# Patient Record
Sex: Female | Born: 1970
Health system: Southern US, Community
[De-identification: ages and names within clinical notes are randomized; demographics above are authoritative.]

## PROBLEM LIST (undated history)

## (undated) DIAGNOSIS — I10 Essential (primary) hypertension: Secondary | ICD-10-CM

## (undated) DIAGNOSIS — H8390 Unspecified disease of inner ear, unspecified ear: Secondary | ICD-10-CM

## (undated) DIAGNOSIS — G43909 Migraine, unspecified, not intractable, without status migrainosus: Secondary | ICD-10-CM

## (undated) DIAGNOSIS — K589 Irritable bowel syndrome without diarrhea: Secondary | ICD-10-CM

## (undated) HISTORY — DX: Unspecified disease of inner ear, unspecified ear: H83.90

## (undated) HISTORY — DX: Essential (primary) hypertension: I10

## (undated) HISTORY — DX: Irritable bowel syndrome, unspecified: K58.9

## (undated) HISTORY — DX: Migraine, unspecified, not intractable, without status migrainosus: G43.909

## (undated) HISTORY — PX: WISDOM TOOTH EXTRACTION: SHX21

## (undated) HISTORY — PX: NASAL SINUS SURGERY: SHX719

---
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
## 1997-07-06 ENCOUNTER — Inpatient Hospital Stay (HOSPITAL_COMMUNITY): Admission: AD | Admit: 1997-07-06 | Discharge: 1997-07-08 | Payer: Self-pay | Admitting: *Deleted

## 1997-07-10 ENCOUNTER — Encounter (HOSPITAL_COMMUNITY): Admission: RE | Admit: 1997-07-10 | Discharge: 1997-10-08 | Payer: Self-pay | Admitting: *Deleted

## 1997-10-20 ENCOUNTER — Encounter (HOSPITAL_COMMUNITY): Admission: RE | Admit: 1997-10-20 | Discharge: 1997-12-30 | Payer: Self-pay | Admitting: *Deleted

## 1998-08-18 ENCOUNTER — Other Ambulatory Visit: Admission: RE | Admit: 1998-08-18 | Discharge: 1998-08-18 | Payer: Self-pay | Admitting: *Deleted

## 1999-07-31 ENCOUNTER — Other Ambulatory Visit: Admission: RE | Admit: 1999-07-31 | Discharge: 1999-07-31 | Payer: Self-pay | Admitting: *Deleted

## 2000-02-05 ENCOUNTER — Inpatient Hospital Stay (HOSPITAL_COMMUNITY): Admission: AD | Admit: 2000-02-05 | Discharge: 2000-02-07 | Payer: Self-pay | Admitting: *Deleted

## 2000-02-14 ENCOUNTER — Encounter: Admission: RE | Admit: 2000-02-14 | Discharge: 2000-05-14 | Payer: Self-pay | Admitting: *Deleted

## 2000-03-17 ENCOUNTER — Other Ambulatory Visit: Admission: RE | Admit: 2000-03-17 | Discharge: 2000-03-17 | Payer: Self-pay | Admitting: *Deleted

## 2001-04-01 ENCOUNTER — Other Ambulatory Visit: Admission: RE | Admit: 2001-04-01 | Discharge: 2001-04-01 | Payer: Self-pay | Admitting: *Deleted

## 2002-04-15 ENCOUNTER — Other Ambulatory Visit: Admission: RE | Admit: 2002-04-15 | Discharge: 2002-04-15 | Payer: Self-pay | Admitting: *Deleted

## 2003-05-11 ENCOUNTER — Other Ambulatory Visit: Admission: RE | Admit: 2003-05-11 | Discharge: 2003-05-11 | Payer: Self-pay | Admitting: *Deleted

## 2004-06-14 ENCOUNTER — Other Ambulatory Visit: Admission: RE | Admit: 2004-06-14 | Discharge: 2004-06-14 | Payer: Self-pay | Admitting: Obstetrics and Gynecology

## 2008-01-01 ENCOUNTER — Encounter: Admission: RE | Admit: 2008-01-01 | Discharge: 2008-01-01 | Payer: Self-pay | Admitting: Internal Medicine

## 2010-01-16 ENCOUNTER — Encounter
Admission: RE | Admit: 2010-01-16 | Discharge: 2010-01-16 | Payer: Self-pay | Source: Home / Self Care | Attending: Gastroenterology | Admitting: Gastroenterology

## 2012-04-16 ENCOUNTER — Other Ambulatory Visit: Payer: Self-pay | Admitting: Occupational Medicine

## 2012-04-16 ENCOUNTER — Ambulatory Visit: Payer: Self-pay

## 2012-04-16 DIAGNOSIS — R7612 Nonspecific reaction to cell mediated immunity measurement of gamma interferon antigen response without active tuberculosis: Secondary | ICD-10-CM

## 2012-10-30 ENCOUNTER — Ambulatory Visit (INDEPENDENT_AMBULATORY_CARE_PROVIDER_SITE_OTHER): Payer: BC Managed Care – PPO | Admitting: Obstetrics and Gynecology

## 2012-10-30 ENCOUNTER — Encounter: Payer: Self-pay | Admitting: Obstetrics and Gynecology

## 2012-10-30 VITALS — BP 130/80 | HR 60 | Temp 98.6°F | Ht 65.5 in | Wt 154.0 lb

## 2012-10-30 DIAGNOSIS — Z01419 Encounter for gynecological examination (general) (routine) without abnormal findings: Secondary | ICD-10-CM

## 2012-10-30 DIAGNOSIS — Z0000 Encounter for general adult medical examination without abnormal findings: Secondary | ICD-10-CM

## 2012-10-30 DIAGNOSIS — N920 Excessive and frequent menstruation with regular cycle: Secondary | ICD-10-CM

## 2012-10-30 LAB — POCT URINALYSIS DIPSTICK
Bilirubin, UA: NEGATIVE
Blood, UA: NEGATIVE
Glucose, UA: NEGATIVE
Ketones, UA: NEGATIVE
Leukocytes, UA: NEGATIVE
Nitrite, UA: NEGATIVE
Protein, UA: NEGATIVE
Urobilinogen, UA: NEGATIVE
pH, UA: 5

## 2012-10-30 NOTE — Progress Notes (Signed)
Patient ID: Denise Jackson, female   DOB: 1971-01-12, 42 y.o.   MRN: [id number] GYNECOLOGY VISIT  PCP:  Gibson Ramp, MD  Referring provider:   HPI: 42 y.o.   Single  Caucasian  female   [id number] with Patient's last menstrual period was 10/09/2012.   here for  AEX.    Regular menses.  Menses last 7 days, becomes heavy on day 3 or 4 with clotting that is significant and clothing staining if not careful. Does it almost every month. Some cramping but not every cycle.  Feels a lot of "pressure."  Lost 10 pounds through [employer].  Some exercise. Had physical exam with Dr. Eula Listen a couple of weeks ago - normal cholesterol, thyroid, CBC.  Hgb:  PCP Urine: Neg  GYNECOLOGIC HISTORY: Patient's last menstrual period was 10/09/2012. Sexually active:  yes Partner preference: female Contraception: condoms   Menopausal hormone therapy: n/a DES exposure:   no Blood transfusions:   no Sexually transmitted diseases:  no  GYN Procedures:  no Mammogram:  10/2011 wnl - did a recall mammogram last year and was all OK.  Has appointment for 11-16-12 [facility].     Pap:  09/2010 wnl. History of abnormal pap smear:  no   OB History   Grav Para Term Preterm Abortions TAB SAB Ect Mult Living   3 3 3       3        LIFESTYLE: Exercise:     walking          Tobacco:     no Alcohol:       no Drug use:   no  OTHER HEALTH MAINTENANCE: Tetanus/TDap:   2012 Gardisil: NA Influenza:   09/2012 Zostavax:  NA  Bone density:  2009 wnl with PCP--[facility]:  n/a  Cholesterol check:  09/2012 wnl  Family History  Problem Relation Age of Onset  . Breast cancer Maternal Aunt     also with fallopian tube CA--DEC  . Hypertension Father   . Diabetes Maternal Grandmother   . Hypertension Maternal Grandmother   . Thyroid disease Maternal Grandmother   . Hypertension Maternal Grandfather   . Stroke Maternal Grandfather   . Hypertension Paternal Grandmother   . Hypertension Paternal  Grandfather     There are no active problems to display for this patient.  History reviewed. No pertinent past medical history.  Past Surgical History  Procedure Laterality Date  . Nasal sinus surgery  1988, 1990    ALLERGIES: Review of patient's allergies indicates no known allergies.  Current Outpatient Prescriptions  Medication Sig Dispense Refill  . ALPRAZolam (XANAX) 0.25 MG tablet Take 1 tablet by mouth as needed.      . Ascorbic Acid (VITAMIN C) 100 MG tablet Take 100 mg by mouth daily.      . calcium carbonate (OS-CAL) 600 MG TABS tablet Take 600 mg by mouth 2 (two) times daily with a meal.      . lactobacillus acidophilus (BACID) TABS tablet Take 2 tablets by mouth 3 (three) times daily.      . Multiple Vitamin (MULTIVITAMIN) capsule Take 1 capsule by mouth daily.      . SUMAtriptan (IMITREX) 25 MG tablet Take 25 mg by mouth every 2 (two) hours as needed for migraine. May repeat in 2 hours if headache persists or recurs.       No current facility-administered medications for this visit.     ROS:  Pertinent items are noted in HPI.  SOCIAL HISTORY:  Married.  Three children. Works for [employer].  PHYSICAL EXAMINATION:    BP 130/80  Pulse 60  Temp(Src) 98.6 F (37 C) (Oral)  Ht 5' 5.5" (1.664 m)  Wt 154 lb (69.854 kg)  BMI 25.23 kg/m2  LMP 10/09/2012   Wt Readings from Last 3 Encounters:  10/30/12 154 lb (69.854 kg)     Ht Readings from Last 3 Encounters:  10/30/12 5' 5.5" (1.664 m)    General appearance: alert, cooperative and appears stated age Head: Normocephalic, without obvious abnormality, atraumatic Neck: no adenopathy, supple, symmetrical, trachea midline and thyroid not enlarged, symmetric, no tenderness/mass/nodules Lungs: clear to auscultation bilaterally Breasts: Inspection negative, No nipple retraction or dimpling, No nipple discharge or bleeding, No axillary or supraclavicular adenopathy, Normal to palpation without dominant  masses Heart: regular rate and rhythm Abdomen: soft, non-tender; no masses,  no organomegaly Extremities: extremities normal, atraumatic, no cyanosis or edema Skin: Skin color, texture, turgor normal. No rashes or lesions Lymph nodes: Cervical, supraclavicular, and axillary nodes normal. No abnormal inguinal nodes palpated Neurologic: Grossly normal  Pelvic: External genitalia:  no lesions              Urethra:  normal appearing urethra with no masses, tenderness or lesions              Bartholins and Skenes: normal                 Vagina: normal appearing vagina with normal color and discharge, no lesions              Cervix: normal .  Bled with pap from endoocervical brush.  I think I broke up some minor scar tissue - stenosis.               Pap and high risk HPV testing done: yes.            Bimanual Exam:  Uterus:  uterus is normal size, shape, consistency and nontender                                      Adnexa: normal adnexa in size, nontender and no masses                                      Rectovaginal: Confirms                                      Anus:  normal sphincter tone, no lesions  ASSESSMENT  Normal gynecologic exam. Menorrhagia.  PLAN  Mammogram at [facility]. Pap smear and high risk HPV testing Return for pelvic ultrasound, sonohysterogram, and endometrial biopsy.  Return annually or prn   An After Visit Summary was printed and given to the patient.

## 2012-10-30 NOTE — Patient Instructions (Signed)

## 2012-11-02 ENCOUNTER — Encounter: Payer: Self-pay | Admitting: Obstetrics and Gynecology

## 2012-11-02 NOTE — Addendum Note (Signed)
Addended by: Conley Simmonds on: 11/02/2012 03:05 PM   Modules accepted: Orders

## 2012-11-05 ENCOUNTER — Telehealth: Payer: Self-pay | Admitting: Obstetrics and Gynecology

## 2012-11-05 LAB — IPS PAP TEST WITH HPV

## 2012-11-09 ENCOUNTER — Telehealth: Payer: Self-pay | Admitting: Obstetrics and Gynecology

## 2012-11-09 NOTE — Telephone Encounter (Signed)
Patient called back in regards to our previous conversation about her benefits for a SHGM. Patient owes [phone] for a SHGM/Endo bx and would like to think about it for a little bit before committing to that amount. Patient also had a question in regards to her options. Patient would like to find out why she is having this problem. Dr. Edward Jolly, mention 3 options. Patient unsure about all options (ablation, b/c, and mirena). Patient would like to know if these will be her only options? Patient is trying to find the best option for her in regards to treatment and cost.

## 2012-11-09 NOTE — Telephone Encounter (Signed)
Phone call to discuss alternative to proceeding with a pelvic ultrasound, sonohysterogram, and endometrial biopsy.  I would be OK with a 3 month trial of low dose combined OCPs or a 3 month trial of a progesterone only OCP to see if symptoms of menorrhagia resolve. Patient may need instructions in use and discussion of side effects.  Patient would then need a three month recheck with me for re-evaluation.  We could then determine the necessity of proceeding with potential imaging and an endometrial biopsy.

## 2012-11-09 NOTE — Telephone Encounter (Signed)
I left patient a message to call in to speak with Triage tomorrow or with me on Wednesday.  I will offer a 3 month trial of an OCP first.  If symptoms do not resolve, then she will need to proceed with imaging and endometrial biopsy.

## 2012-11-11 NOTE — Telephone Encounter (Signed)
Spoke with patient.  Message from Dr. Edward Jolly given.  She will consider birth control pills but that previously she took them and headaches and was unsure about taking pills again as well.  She would like to think about her options and call back.

## 2012-11-11 NOTE — Telephone Encounter (Signed)
No chief complaint on file. pt calling to speak with tracy per Dr. Rica Records instructions.

## 2012-11-11 NOTE — Telephone Encounter (Signed)
Denise Jackson,  Please contact the patient and inform her that it can be a progesterone only OCP. This is not likely to cause headaches!

## 2012-11-12 NOTE — Telephone Encounter (Signed)
Detailed message left on voicemail stating patient's first and last name. Advised of message from Dr. Edward Jolly, advised that she can call back with any questions.

## 2013-03-22 ENCOUNTER — Other Ambulatory Visit: Payer: Self-pay | Admitting: Otolaryngology

## 2013-03-22 DIAGNOSIS — R42 Dizziness and giddiness: Secondary | ICD-10-CM

## 2013-03-31 ENCOUNTER — Ambulatory Visit
Admission: RE | Admit: 2013-03-31 | Discharge: 2013-03-31 | Disposition: A | Discharge status: Home / Self Care | Payer: BC Managed Care – PPO | Source: Ambulatory Visit | Attending: Otolaryngology | Admitting: Otolaryngology

## 2013-03-31 DIAGNOSIS — R42 Dizziness and giddiness: Secondary | ICD-10-CM

## 2013-03-31 MED ORDER — GADOBENATE DIMEGLUMINE 529 MG/ML IV SOLN
12.0000 mL | Freq: Once | INTRAVENOUS | Status: AC | PRN
  Administered 2013-03-31: 12 mL via INTRAVENOUS

## 2013-04-16 ENCOUNTER — Ambulatory Visit (INDEPENDENT_AMBULATORY_CARE_PROVIDER_SITE_OTHER): Payer: BC Managed Care – PPO | Admitting: Diagnostic Neuroimaging

## 2013-04-16 ENCOUNTER — Encounter: Payer: Self-pay | Admitting: Diagnostic Neuroimaging

## 2013-04-16 ENCOUNTER — Encounter (INDEPENDENT_AMBULATORY_CARE_PROVIDER_SITE_OTHER): Payer: Self-pay

## 2013-04-16 VITALS — BP 147/96 | HR 85 | Ht 65.5 in | Wt 135.0 lb

## 2013-04-16 DIAGNOSIS — R42 Dizziness and giddiness: Secondary | ICD-10-CM

## 2013-04-16 NOTE — Patient Instructions (Signed)
Stay hydrated.   Follow up as needed.

## 2013-04-16 NOTE — Progress Notes (Signed)
[facility]  PATIENT: Denise Jackson DOB: [birth year]  REFERRING CLINICIAN: Jearld FentonByers HISTORY FROM: patient REASON FOR VISIT: new consult   HISTORICAL  CHIEF COMPLAINT:  Chief Complaint  Patient presents with  . Dizziness    HISTORY OF PRESENT ILLNESS:   43 year old right-handed female with migraine, IBS, here for evaluation of dizziness episodes.  January 2014 patient had a "cold" with left ear stopped up sensation, with subsequent intermittent dizziness for one and half months. She describes difficulty focusing, swimming headed sensation, mild nausea, "blah" feeling. Symptoms resolved spontaneously.  December 2014 symptoms recurred. This time patient's symptoms lasted for 3 months. Over past 2-3 weeks symptoms have eventually resolve. Similar quality of symptoms.  Patient denies frank room spinning vertigo sensation. No double vision slurred speech or trouble talking. No trouble with her arms or legs. No bowel or bladder incontinence.   Patient also has history of "menstrual migraine" consisting of monthly, bandlike frontal headaches with photophobia, nausea, severe pain lasting up to 2 days a time. Patient changes her birth control pill last year and since that time her migraine headaches have resolved.  REVIEW OF SYSTEMS: Full 14 system review of systems performed and notable only for headache dizziness frequent infections sinus infections.  ALLERGIES: No Known Allergies  HOME MEDICATIONS: Outpatient Prescriptions Prior to Visit  Medication Sig Dispense Refill  . ALPRAZolam (XANAX) 0.25 MG tablet Take 1 tablet by mouth as needed.      . Ascorbic Acid (VITAMIN C) 100 MG tablet Take 500 mg by mouth daily.       . calcium carbonate (OS-CAL) 600 MG TABS tablet Take 600 mg by mouth 2 (two) times daily with a meal.      . lactobacillus acidophilus (BACID) TABS tablet Take 2 tablets by mouth 3 (three) times daily.      . Multiple Vitamin (MULTIVITAMIN) capsule  Take 1 capsule by mouth daily.      . SUMAtriptan (IMITREX) 25 MG tablet Take 25 mg by mouth every 2 (two) hours as needed for migraine. May repeat in 2 hours if headache persists or recurs.       No facility-administered medications prior to visit.    PAST MEDICAL HISTORY: Past Medical History  Diagnosis Date  . IBS (irritable bowel syndrome)   . Migraine     PAST SURGICAL HISTORY: Past Surgical History  Procedure Laterality Date  . Nasal sinus surgery  1988, 1990  . Nasal septum surgery  1988  . Wisdom tooth extraction      FAMILY HISTORY: Family History  Problem Relation Age of Onset  . Breast cancer Maternal Aunt     also with fallopian tube CA--DEC  . Hypertension Father   . Diabetes Maternal Grandmother   . Hypertension Maternal Grandmother   . Thyroid disease Maternal Grandmother   . Hypertension Maternal Grandfather   . Stroke Maternal Grandfather   . Hypertension Paternal Grandmother   . Hypertension Paternal Grandfather     SOCIAL HISTORY:  History   Social History  . Marital Status: Married    Spouse Name: Ephriam KnucklesMike Georgiades    Number of Children: 3  . Years of Education: College   Occupational History  .      [employer]   Social History Main Topics  . Smoking status: Never Smoker   . Smokeless tobacco: Never Used  . Alcohol Use: No  . Drug Use: No  . Sexual Activity: Yes    Partners: Male    Birth Control/  Protection: Condom   Other Topics Concern  . Not on file   Social History Narrative   Patient lives at home with her family.   Caffeine Use: 1-2 cups daily     PHYSICAL EXAM  Filed Vitals:   04/16/13 0928 04/16/13 0936 04/16/13 0938  BP:  149/94 147/96  Pulse:  83 85  Height: 5' 5.5" (1.664 m)    Weight: 135 lb (61.236 kg)      Not recorded    Body mass index is 22.12 kg/(m^2).  GENERAL EXAM: Patient is in no distress; well developed, nourished and groomed; neck is supple  CARDIOVASCULAR: Regular rate and rhythm, no  murmurs, no carotid bruits  NEUROLOGIC: MENTAL STATUS: awake, alert, oriented to person, place and time, recent and remote memory intact, normal attention and concentration, language fluent, comprehension intact, naming intact, fund of knowledge appropriate CRANIAL NERVE: no papilledema on fundoscopic exam, pupils equal and reactive to light, visual fields full to confrontation, extraocular muscles intact, no nystagmus, facial sensation and strength symmetric, hearing intact, palate elevates symmetrically, uvula midline, shoulder shrug symmetric, tongue midline. MOTOR: normal bulk and tone, full strength in the BUE, BLE SENSORY: normal and symmetric to light touch, pinprick, temperature, vibration COORDINATION: finger-nose-finger, fine finger movements normal REFLEXES: deep tendon reflexes present and symmetric GAIT/STATION: narrow based gait; able to walk on toes, heels and tandem; romberg is negative    DIAGNOSTIC DATA (LABS, IMAGING, TESTING) - I reviewed patient records, labs, notes, testing and imaging myself where available.  No results found for this basename: WBC, HGB, HCT, MCV, PLT   No results found for this basename: na, k, cl, co2, glucose, bun, creatinine, calcium, prot, albumin, ast, alt, alkphos, bilitot, gfrnonaa, gfraa   No results found for this basename: CHOL, HDL, LDLCALC, LDLDIRECT, TRIG, CHOLHDL   No results found for this basename: HGBA1C   No results found for this basename: VITAMINB12   No results found for this basename: TSH    I reviewed images myself and agree with interpretation. Minimal non-specific gliosis, likely migraine associated vs normal variant. -VRP  03/31/13 MRI brain: 1. No evidence of acute intracranial abnormality or mass.  2. A few punctate foci of white matter T2 signal abnormality. This  is nonspecific, with considerations including minimal chronic small  vessel disease, sequelae of migraines, and prior trauma or  infection, among  others.   ASSESSMENT AND PLAN  43 y.o. year old female here with intermittent, non-specific dizziness. Neuro exam is normal. MRI is unremarkable. No clear neurologic etiology at this time. Will monitor symptoms over time. Symptoms have spontaneously resolved.  PLAN: - monitor symptoms   Return if symptoms worsen or fail to improve, for return to PCP.    Suanne Marker, MD 04/16/2013, 10:02 AM Certified in Neurology, Neurophysiology and Neuroimaging  [facility] [address] [place], [place] [address] [phone]

## 2013-06-04 ENCOUNTER — Telehealth: Payer: Self-pay | Admitting: Obstetrics and Gynecology

## 2013-06-04 DIAGNOSIS — N920 Excessive and frequent menstruation with regular cycle: Secondary | ICD-10-CM

## 2013-06-04 NOTE — Telephone Encounter (Signed)
Martie LeeSabrina, can you call her and discuss cost.

## 2013-06-04 NOTE — Telephone Encounter (Signed)
Any further follow up needed?

## 2013-06-04 NOTE — Telephone Encounter (Signed)
Spoke with patient. Patient states that she was unable to proceed with [facility] earlier this year due to cost. Son recently had to have surgery and deductible has now been met. Patient would like to know what it would cost now for her to proceed with [facility]. Advised would speak with our billing office and have them run her benefits for the [facility] again and we will give her a call back with further information. Will scheduled at that time if patient is agreeable to pricing. Patient agreeable.

## 2013-06-04 NOTE — Telephone Encounter (Signed)
Returning a call to Kaitlyn. °

## 2013-06-04 NOTE — Telephone Encounter (Signed)
Left message to call [place] at [phone].  Advise patient that billing department checked with insurance company about coverage from [facility]. Her last precert was done in 4462. She has not yet met her 2015 calendar year deductible which is $5460 and she has only satisfied $173.86. She would be responsible for $931.13.

## 2013-06-04 NOTE — Telephone Encounter (Signed)
Spoke with patient. Advised of the benefit quote received. She states that she was under the impression that the 2015 deductible has been met. I provided her with the details of my call to the insurance company today. She will call to the insurance company to discuss further and will contact us if she has additional questions/concerns/or is ready to schedule. ID: [id number] DOB: [birth year] CPT: 76856/76831/58340/OV Dx:    Effective Date: 01.01.2015 Termination Date: Pre-Exisiting: n/a  Date: 05.15.2015 Time: 1205 Rep: Keisha  [place]:  Deductible: $5460 ($173.86 met) OOP: Coins:  PAC: n/a Ref# [id number] Allowed amount: $931.13   PR: $931.13

## 2013-06-04 NOTE — Telephone Encounter (Signed)
Pt calling to

## 2013-06-04 NOTE — Telephone Encounter (Signed)
Pt says she is not sure the name if the procedure Dr. Edward JollySilva wanted her to have but would like to come back in and discuss with her.

## 2013-06-06 NOTE — Telephone Encounter (Signed)
Kennon RoundsSally,   How about proceeding with just a pelvic ultrasound?  After that is done, it could be more clear if the sonohysterogram and an endometrial biopsy are needed. Those could be done on a separate day if necessary.  With that plan, I could help the patient sooner!

## 2013-06-07 NOTE — Telephone Encounter (Signed)
$  408.26 for PUS only

## 2013-06-07 NOTE — Telephone Encounter (Signed)
Martie LeeSabrina, what is her about due for PUS only?

## 2013-06-07 NOTE — Telephone Encounter (Signed)
Left message to call Gaylon Melchor at [phone].  Inform patient of message from Dr.Silva regarding PUS.

## 2013-06-07 NOTE — Telephone Encounter (Signed)
Returning a call to Kaitlyn. °

## 2013-06-07 NOTE — Telephone Encounter (Signed)
Left message to call Graci Hulce at [phone]. 

## 2013-06-09 NOTE — Telephone Encounter (Signed)
Spoke with patient. Advised of message from [place] and OOP cost for PUS. Patient states that she spoke with "Ron" at the [facility] office for [place] and was told that she had met her deductible for the year. Patient is confused by why we received different information when we contacted her insurance. Would like this to be double checked to make sure the cost is accurate before making a decision. Advised would speak with our billing office and have them double check with her insurance about coverage and cost and give patient a call back with further information. Patient agreeable.  CC: Denise Jackson

## 2013-06-09 NOTE — Telephone Encounter (Signed)
[facility], patient has satisfied her deductible and out of pocket maximum. Her plan will pay at 100% of allowable. Patient liability will be 0.

## 2013-06-09 NOTE — Telephone Encounter (Signed)
Spoke with patient. Advised Denise Jackson checked with [employer] and patient will have a liability of $0. Patient is agreeable. Patient states that she would like to continue with the PUS first and see if she needs to have the [facility]HGM and EMB after that. Ultrasound scheduled for 6/4 at 9:00am with a 9:30am consult with Dr.Silva. Patient verbalized understanding of the [place] appointment cancellation policy. Advised will need to cancel within 72 business hours (3 business days) or will have $100.00 no show fee placed to account.   Routing to provider for final review. Patient agreeable to disposition. Will close encounter

## 2013-06-23 ENCOUNTER — Telehealth: Payer: Self-pay | Admitting: Obstetrics and Gynecology

## 2013-06-23 NOTE — Telephone Encounter (Signed)
Returning a call to [place]. Patient confirmed her PUS appointment tomorrow, however asked to talk to [place].

## 2013-06-23 NOTE — Telephone Encounter (Signed)
Left message for patient to call back  

## 2013-06-23 NOTE — Telephone Encounter (Signed)
Patient is calling she has another question for sabrina said she spoke with her this morning if no answer on home call cell

## 2013-06-24 ENCOUNTER — Encounter: Payer: Self-pay | Admitting: Obstetrics and Gynecology

## 2013-06-24 ENCOUNTER — Ambulatory Visit (INDEPENDENT_AMBULATORY_CARE_PROVIDER_SITE_OTHER): Payer: BC Managed Care – PPO

## 2013-06-24 ENCOUNTER — Other Ambulatory Visit: Payer: BC Managed Care – PPO | Admitting: Obstetrics and Gynecology

## 2013-06-24 ENCOUNTER — Other Ambulatory Visit: Payer: BC Managed Care – PPO

## 2013-06-24 ENCOUNTER — Ambulatory Visit (INDEPENDENT_AMBULATORY_CARE_PROVIDER_SITE_OTHER): Payer: BC Managed Care – PPO | Admitting: Obstetrics and Gynecology

## 2013-06-24 VITALS — BP 104/80 | Ht 65.5 in | Wt 134.0 lb

## 2013-06-24 DIAGNOSIS — N921 Excessive and frequent menstruation with irregular cycle: Secondary | ICD-10-CM

## 2013-06-24 DIAGNOSIS — N920 Excessive and frequent menstruation with regular cycle: Secondary | ICD-10-CM

## 2013-06-24 NOTE — Progress Notes (Signed)
Subjective  43 years old Patient is here for pelvic ultrasound today. Having heavy cycles, but  Not every time 10 of 12 are heavy.  Bleeding can catch patient off guard.  No skipped cycles.  Spots before menses is due for 4 -5 days.  LMP 05/24/13 - 06/05/13 this time.  Very prolonged.  More soreness of breasts. Some nodularity on both sides.  Mammogram was in October 2014 - normal at [place].  Does not tolerate estrogen containing birth control pills. Had headaches, menstrual migraines when was on OCPs.  No aura.  Migraine specialist seen.   Had an MRI of brain in March 2015 due to dizziness. Had unremarkable MRI.  Objective  Images and report personally reviewed by me and patient.   See ultrasound below showing uterus with no myometrial masses. EMS 15.04 mm with cystic area. Right ovary normal. Left ovary with 17 mm CL cyst.  No free fluid.      Assessment  Menometrorrhagia. Thickened endometrium. Intolerance to combined oral contraceptive causing menstrual migraines.   Plan  Schedule endometrial biopsy.  I discussed options for treatment of the above including progesterone only birth control, Mirena IUD, endometrial ablation, and hysterectomy.   Written information on Micronor and ablation to patient.   25 minutes face to face time of which over 50% was spent in counseling.   After visit summary to patient.

## 2013-06-24 NOTE — Patient Instructions (Addendum)
Endometrial Biopsy Endometrial biopsy is a procedure in which a tissue sample is taken from inside the uterus. The tissue sample is then looked at under a microscope to see if the tissue is normal or abnormal. The endometrium is the lining of the uterus. This procedure helps determine where you are in your menstrual cycle and how hormone levels are affecting the lining of the uterus. This procedure may also be used to evaluate uterine bleeding or to diagnose endometrial cancer, tuberculosis, polyps, or inflammatory conditions.  LET [facility] CARE PROVIDER KNOW ABOUT:  Any allergies you have.  All medicines you are taking, including vitamins, herbs, eye drops, creams, and over-the-counter medicines.  Previous problems you or members of your family have had with the use of anesthetics.  Any blood disorders you have.  Previous surgeries you have had.  Medical conditions you have.  Possibility of pregnancy. RISKS AND COMPLICATIONS Generally, this is a safe procedure. However, as with any procedure, complications can occur. Possible complications include:  Bleeding.  Pelvic infection.  Puncture of the uterine wall with the biopsy device (rare). BEFORE THE PROCEDURE   Keep a record of your menstrual cycles as directed by your health care provider. You may need to schedule your procedure for a specific time in your cycle.  You may want to bring a sanitary pad to wear home after the procedure.  Arrange for someone to drive you home after the procedure if you will be given a medicine to help you relax (sedative). PROCEDURE   You may be given a sedative to relax you.  You will lie on an exam table with your feet and legs supported as in a pelvic exam.  Your health care provider will insert an instrument (speculum) into your vagina to see your cervix.  Your cervix will be cleansed with an antiseptic solution. A medicine (local anesthetic) will be used to numb the cervix.  A forceps  instrument (tenaculum) will be used to hold your cervix steady for the biopsy.  A thin, rodlike instrument (uterine sound) will be inserted through your cervix to determine the length of your uterus and the location where the biopsy sample will be removed.  A thin, flexible tube (catheter) will be inserted through your cervix and into the uterus. The catheter is used to collect the biopsy sample from your endometrial tissue.  The catheter and speculum will then be removed, and the tissue sample will be sent to a lab for examination. AFTER THE PROCEDURE  You will rest in a recovery area until you are ready to go home.  You may have mild cramping and a small amount of vaginal bleeding for a few days after the procedure. This is normal.  Make sure you find out how to get your test results. Document Released: 05/10/2004 Document Revised: 09/09/2012 Document Reviewed: 06/24/2012 [facility] Patient Information 2014 [place], [place].  Norethindrone tablets (contraception) What is this medicine? NORETHINDRONE (nor eth IN drone) is an oral contraceptive. The product contains a female hormone known as a progestin. It is used to prevent pregnancy. This medicine may be used for other purposes; ask your health care provider or pharmacist if you have questions. COMMON BRAND NAME(S): Camila, Errin , [place], [place], [place] , [place], Nor-QD, Nora-BE, Ortho Micronor What should I tell my health care provider before I take this medicine? They need to know if you have any of these conditions: -blood vessel disease or blood clots -breast, cervical, or vaginal cancer -diabetes -heart disease -kidney disease -  liver disease -mental depression -migraine -seizures -stroke -vaginal bleeding -an unusual or allergic reaction to norethindrone, other medicines, foods, dyes, or preservatives -pregnant or trying to get pregnant -breast-feeding How should I use this medicine? Take this medicine by mouth with a  glass of water. You may take it with or without food. Follow the directions on the prescription label. Take this medicine at the same time each day and in the order directed on the package. Do not take your medicine more often than directed. Contact your pediatrician regarding the use of this medicine in children. Special care may be needed. This medicine has been used in female children who have started having menstrual periods. A patient package insert for the product will be given with each prescription and refill. Read this sheet carefully each time. The sheet may change frequently. Overdosage: If you think you have taken too much of this medicine contact a poison control center or emergency room at once. NOTE: This medicine is only for you. Do not share this medicine with others. What if I miss a dose? Try not to miss a dose. Every time you miss a dose or take a dose late your chance of pregnancy increases. When 1 pill is missed (even if only 3 hours late), take the missed pill as soon as possible and continue taking a pill each day at the regular time (use a back up method of birth control for the next 48 hours). If more than 1 dose is missed, use an additional birth control method for the rest of your pill pack until menses occurs. Contact your health care professional if more than 1 dose has been missed. What may interact with this medicine? Do not take this medicine with any of the following medications: -amprenavir or fosamprenavir -bosentan This medicine may also interact with the following medications: -antibiotics or medicines for infections, especially rifampin, rifabutin, rifapentine, and griseofulvin, and possibly penicillins or tetracyclines -aprepitant -barbiturate medicines, such as phenobarbital -carbamazepine -felbamate -modafinil -oxcarbazepine -phenytoin -ritonavir or other medicines for HIV infection or AIDS -St. John's wort -topiramate This list may not describe all  possible interactions. Give your health care provider a list of all the medicines, herbs, non-prescription drugs, or dietary supplements you use. Also tell them if you smoke, drink alcohol, or use illegal drugs. Some items may interact with your medicine. What should I watch for while using this medicine? Visit your doctor or health care professional for regular checks on your progress. You will need a regular breast and pelvic exam and Pap smear while on this medicine. Use an additional method of birth control during the first cycle that you take these tablets. If you have any reason to think you are pregnant, stop taking this medicine right away and contact your doctor or health care professional. If you are taking this medicine for hormone related problems, it may take several cycles of use to see improvement in your condition. This medicine does not protect you against HIV infection (AIDS) or any other sexually transmitted diseases. What side effects may I notice from receiving this medicine? Side effects that you should report to your doctor or health care professional as soon as possible: -breast tenderness or discharge -pain in the abdomen, chest, groin or leg -severe headache -skin rash, itching, or hives -sudden shortness of breath -unusually weak or tired -vision or speech problems -yellowing of skin or eyes Side effects that usually do not require medical attention (report to your doctor or health care professional  if they continue or are bothersome): -changes in sexual desire -change in menstrual flow -facial hair growth -fluid retention and swelling -headache -irritability -nausea -weight gain or loss This list may not describe all possible side effects. Call your doctor for medical advice about side effects. You may report side effects to FDA at 1-800-FDA-1088. Where should I keep my medicine? Keep out of the reach of children. Store at room temperature between 15 and 30  degrees C (59 and 86 degrees F). Throw away any unused medicine after the expiration date. NOTE: This sheet is a summary. It may not cover all possible information. If you have questions about this medicine, talk to your doctor, pharmacist, or health care provider.  2014, Elsevier/Gold Standard. (2011-09-27 16:41:35)  Endometrial Ablation Endometrial ablation removes the lining of the uterus (endometrium). It is usually a same-day, outpatient treatment. Ablation helps avoid major surgery, such as surgery to remove the cervix and uterus (hysterectomy). After endometrial ablation, you will have little or no menstrual bleeding and may not be able to have children. However, if you are premenopausal, you will need to use a reliable method of birth control following the procedure because of the small chance that pregnancy can occur. There are different reasons to have this procedure, which include:  Heavy periods.  Bleeding that is causing anemia.  Irregular bleeding.  Bleeding fibroids on the lining inside the uterus if they are smaller than 3 centimeters. This procedure should not be done if:  You want children in the future.  You have severe cramps with your menstrual period.  You have precancerous or cancerous cells in your uterus.  You were recently pregnant.  You have gone through menopause.  You have had major surgery on the uterus, such as a cesarean delivery. LET [facility] CARE PROVIDER KNOW ABOUT:  Any allergies you have.  All medicines you are taking, including vitamins, herbs, eye drops, creams, and over-the-counter medicines.  Previous problems you or members of your family have had with the use of anesthetics.  Any blood disorders you have.  Previous surgeries you have had.  Medical conditions you have. RISKS AND COMPLICATIONS  Generally, this is a safe procedure. However, as with any procedure, complications can occur. Possible complications  include:  Perforation of the uterus.  Bleeding.  Infection of the uterus, bladder, or vagina.  Injury to surrounding organs.  An air bubble to the lung (air embolus).  Pregnancy following the procedure.  Failure of the procedure to help the problem, requiring hysterectomy.  Decreased ability to diagnose cancer in the lining of the uterus. BEFORE THE PROCEDURE  The lining of the uterus must be tested to make sure there is no pre-cancerous or cancer cells present.  An ultrasound may be performed to look at the size of the uterus and to check for abnormalities.  Medicines may be given to thin the lining of the uterus. PROCEDURE  During the procedure, your health care provider will use a tool called a resectoscope to help see inside your uterus. There are different ways to remove the lining of your uterus.   Radiofrequency  This method uses a radiofrequency-alternating electric current to remove the lining of the uterus.  Cryotherapy This method uses extreme cold to freeze the lining of the uterus.  Heated-Free Liquid  This method uses heated salt (saline) solution to remove the lining of the uterus.  Microwave This method uses high-energy microwaves to heat up the lining of the uterus to remove it.  Thermal balloon  This method involves inserting a catheter with a balloon tip into the uterus. The balloon tip is filled with heated fluid to remove the lining of the uterus. AFTER THE PROCEDURE  After your procedure, do not have sexual intercourse or insert anything into your vagina until permitted by your health care provider. After the procedure, you may experience:  Cramps.  Vaginal discharge.  Frequent urination. Document Released: 11/17/2003 Document Revised: 09/09/2012 Document Reviewed: 06/10/2012 [facility] Patient Information 2014 [place], [place].

## 2013-07-02 ENCOUNTER — Telehealth: Payer: Self-pay | Admitting: Obstetrics and Gynecology

## 2013-07-02 NOTE — Telephone Encounter (Signed)
Left message for patient to call back. Need to go over benefits for EMBx

## 2013-07-08 NOTE — Telephone Encounter (Signed)
Return call to Sabrina. °

## 2013-07-12 NOTE — Telephone Encounter (Signed)
Spoke with patient. Patient states that she will be finishing cycle around the week of July 6th. Would like to schedule for that week. Appointment scheduled for July 9th at 1300 with Dr.Silva. Patient agreeable to date and time. Patient will call back if anything changes with cycle so that we can reschedule. Motrin instructions given.800 mg (Can purchase over the counter, you will need four 200 mg pills) 1 hour before appointment.  Take with food.  Be sure to eat and drink before appointment.  Patient agreeable and verbalizes understanding.  Routing to provider for final review. Patient agreeable to disposition. Will close encounter

## 2013-07-12 NOTE — Telephone Encounter (Signed)
Spoke with patient. Patient would like to schedule EMB at this time. Patient states "I was told that it has to be around a certain time of your cycle and I can not remember when." Advised unless recommended by the doctor we can schedule at any time that works for her and Dr.Silva's scheduled. "I do not remember if it was Dr.Silva or the nurse who told me that." Advised would send a message over to Dr.Silva for clarification before we schedule. Patient is agreeable and verbalizes understanding.  Dr.Silva, is there a specific time during the month that you would like patient to schedule or is it okay to schedule at any time that works for you and her?

## 2013-07-12 NOTE — Telephone Encounter (Signed)
Spoke with patient. Advised that per benefit quote receive, she has 0 out of pocket responsibility when she comes in for endo bx. Passed call to triage for scheduling

## 2013-07-12 NOTE — Telephone Encounter (Signed)
EMB needs to be scheduled appropriately just at end of cycle or just after Denise Jackson finishes so we know that the patient is not pregnant.

## 2013-07-29 ENCOUNTER — Ambulatory Visit (INDEPENDENT_AMBULATORY_CARE_PROVIDER_SITE_OTHER): Payer: BC Managed Care – PPO | Admitting: Obstetrics and Gynecology

## 2013-07-29 ENCOUNTER — Encounter: Payer: Self-pay | Admitting: Obstetrics and Gynecology

## 2013-07-29 VITALS — BP 110/70 | HR 80 | Resp 18 | Ht 65.5 in | Wt 136.0 lb

## 2013-07-29 DIAGNOSIS — N920 Excessive and frequent menstruation with regular cycle: Secondary | ICD-10-CM

## 2013-07-29 DIAGNOSIS — N921 Excessive and frequent menstruation with irregular cycle: Secondary | ICD-10-CM

## 2013-07-29 LAB — CBC
HCT: 38.4 % (ref 36.0–46.0)
Hemoglobin: 13.2 g/dL (ref 12.0–15.0)
MCH: 30.9 pg (ref 26.0–34.0)
MCHC: 34.4 g/dL (ref 30.0–36.0)
MCV: 89.9 fL (ref 78.0–100.0)
Platelets: 252 10*3/uL (ref 150–400)
RBC: 4.27 MIL/uL (ref 3.87–5.11)
RDW: 13.9 % (ref 11.5–15.5)
WBC: 8.8 10*3/uL (ref 4.0–10.5)

## 2013-07-29 NOTE — Progress Notes (Signed)
GYNECOLOGY  VISIT   HPI: 43 y.o.   Married  Caucasian  female   [phone] with Patient's last menstrual period was 07/20/2013.   here for   EMB  Having heavy cycles, but Not every time 10 of 12 are heavy.  Bleeding can catch patient off guard.  No skipped cycles.  Spots before menses is due for 4 -5 days.    Does not tolerate estrogen containing birth control pills.  Had headaches, menstrual migraines when was on OCPs. No aura.  Migraine specialist seen.   Ultrasound on 06/24/13 - showing uterus with no myometrial masses.  EMS 15.04 mm with cystic area.  Right ovary normal.  Left ovary with 17 mm CL cyst.  No free fluid.   Had thyroid testing in August or September 2014 - Dr. Eula Listen - normal.   GYNECOLOGIC HISTORY: Patient's last menstrual period was 07/20/2013. Contraception:  Condoms   Menopausal hormone therapy: N/A        OB History   Grav Para Term Preterm Abortions TAB SAB Ect Mult Living   3 3 3       3          There are no active problems to display for this patient.   Past Medical History  Diagnosis Date  . IBS (irritable bowel syndrome)   . Migraine     Past Surgical History  Procedure Laterality Date  . Nasal sinus surgery  1988, 1990  . Nasal septum surgery  1988  . Wisdom tooth extraction      Current Outpatient Prescriptions  Medication Sig Dispense Refill  . Ascorbic Acid (VITAMIN C) 100 MG tablet Take 500 mg by mouth daily.       . calcium carbonate (OS-CAL) 600 MG TABS tablet Take 600 mg by mouth 2 (two) times daily with a meal.      . Fexofenadine HCl (ALLEGRA ALLERGY PO) Take 1 tablet by mouth daily.      Marland Kitchen ibuprofen (ADVIL,MOTRIN) 200 MG tablet Take 200 mg by mouth every 6 (six) hours as needed.      . lactobacillus acidophilus (BACID) TABS tablet Take 2 tablets by mouth 3 (three) times daily.      . Methylcellulose, Laxative, (CITRUCEL PO) Take by mouth.      . Multiple Vitamin (MULTIVITAMIN) capsule Take 1 capsule by mouth daily.      Marland Kitchen  acetaminophen (TYLENOL) 325 MG tablet Take 650 mg by mouth every 6 (six) hours as needed.      . ALPRAZolam (XANAX) 0.25 MG tablet Take 1 tablet by mouth as needed.      . meclizine (ANTIVERT) 25 MG tablet Take 25 mg by mouth 3 (three) times daily as needed for dizziness.      Marland Kitchen Phenylephrine HCl (AFRIN ALLERGY NA) Place 1 spray into the nose as needed.      . SUMAtriptan (IMITREX) 25 MG tablet Take 25 mg by mouth every 2 (two) hours as needed for migraine. May repeat in 2 hours if headache persists or recurs.      . Triamcinolone Acetonide (NASACORT AQ NA) Place 1 spray into the nose as needed.       No current facility-administered medications for this visit.     ALLERGIES: Review of patient's allergies indicates no known allergies.  Family History  Problem Relation Age of Onset  . Breast cancer Maternal Aunt     also with fallopian tube CA--DEC  . Hypertension Father   . Diabetes Maternal Grandmother   .  Hypertension Maternal Grandmother   . Thyroid disease Maternal Grandmother   . Hypertension Maternal Grandfather   . Stroke Maternal Grandfather   . Hypertension Paternal Grandmother   . Hypertension Paternal Grandfather     History   Social History  . Marital Status: Married    Spouse Name: Ephriam KnucklesMike Mersereau    Number of Children: 3  . Years of Education: College   Occupational History  .      [employer]   Social History Main Topics  . Smoking status: Never Smoker   . Smokeless tobacco: Never Used  . Alcohol Use: No  . Drug Use: No  . Sexual Activity: Yes    Partners: Male    Birth Control/ Protection: Condom   Other Topics Concern  . Not on file   Social History Narrative   Patient lives at home with her family.   Caffeine Use: 1-2 cups daily    ROS:  Pertinent items are noted in HPI.  PHYSICAL EXAMINATION:    BP 110/70  Pulse 80  Resp 18  Ht 5' 5.5" (1.664 m)  Wt 136 lb (61.689 kg)  BMI 22.28 kg/m2  LMP 07/20/2013     General appearance: alert,  cooperative and appears stated age   Endometrial biopsy Consent for procedure.  Speculum placed in vagina.  Prep of cervix with Hibiclens.  Tenaculum to anterior cervical lip.  Pipelle passed to 8 cm twice.  Tissue to pathology.  No complications.  Minimal EBL.    ASSESSMENT  Menorrhagia.  Metrorrhagia.  PLAN  Follow up biopsy results.  Precautions given for follow up of biopsy.  Check CBC today.  Leaning toward trying progesterone only birth control pill.   An After Visit Summary was printed and given to the patient.  __15____ minutes face to face time of which over 50% was spent in counseling.

## 2013-07-29 NOTE — Patient Instructions (Signed)
Endometrial Biopsy, Care After Refer to this sheet in the next few weeks. These instructions provide you with information on caring for yourself after your procedure. Your health care provider may also give you more specific instructions. Your treatment has been planned according to current medical practices, but problems sometimes occur. Call your health care provider if you have any problems or questions after your procedure. WHAT TO EXPECT AFTER THE PROCEDURE After your procedure, it is typical to have the following:  You may have mild cramping and a small amount of vaginal bleeding for a few days after the procedure. This is normal. HOME CARE INSTRUCTIONS  Only take over-the-counter or prescription medicine as directed by your health care provider.  Do not douche, use tampons, or have sexual intercourse until your health care provider approves.  Follow your health care provider's instructions regarding any activity restrictions, such as strenuous exercise or heavy lifting. SEEK MEDICAL CARE IF:  You have heavy bleeding or bleeding longer than 2 days after the procedure.  You have bad smelling drainage from your vagina.  You have a fever and chills.  Youhave severe lower stomach (abdominal) pain. SEEK IMMEDIATE MEDICAL CARE IF:  You have severe cramps in your stomach or back.  You pass large blood clots.  Your bleeding increases.  You become weak or lightheaded, or you pass out. Document Released: 10/28/2012 Document Reviewed: 10/28/2012 [employer] Patient Information 2015 [employer]. This information is not intended to replace advice given to you by your health care provider. Make sure you discuss any questions you have with your health care provider.  

## 2013-08-02 LAB — IPS OTHER TISSUE BIOPSY

## 2013-08-06 ENCOUNTER — Other Ambulatory Visit: Payer: Self-pay

## 2013-08-06 DIAGNOSIS — Z3009 Encounter for other general counseling and advice on contraception: Secondary | ICD-10-CM

## 2013-08-06 MED ORDER — NORETHINDRONE 0.35 MG PO TABS: 1.0000 | ORAL_TABLET | Freq: Every day | ORAL | Status: DC

## 2013-09-13 ENCOUNTER — Telehealth: Payer: Self-pay | Admitting: Obstetrics and Gynecology

## 2013-09-13 NOTE — Telephone Encounter (Signed)
Patient has taken one month of norethindrone. She has questions about the package. Advised to continue to take the pills and not have a break, to take one pill each day at the same time and start on the next pack without breaks. Patient states that period has been lighter this cycle, but she is waiting until further into her cycle to see what happens. Patient also states that she has been more tearful than usual with this cycle. Advised patient can have some initial side effects when starting new pills. Can watch symptoms and see if improves over first 3 months. Patient agreeable. Routing to provider for final review. Patient agreeable to disposition. Will close encounter

## 2013-09-13 NOTE — Telephone Encounter (Signed)
Patient has questions about her birth control. °

## 2013-09-20 ENCOUNTER — Encounter: Payer: Self-pay | Admitting: Obstetrics and Gynecology

## 2013-11-01 ENCOUNTER — Ambulatory Visit: Payer: BC Managed Care – PPO | Admitting: Obstetrics and Gynecology

## 2013-11-10 ENCOUNTER — Ambulatory Visit: Payer: BC Managed Care – PPO | Admitting: Obstetrics and Gynecology

## 2013-11-12 ENCOUNTER — Ambulatory Visit (INDEPENDENT_AMBULATORY_CARE_PROVIDER_SITE_OTHER): Payer: BC Managed Care – PPO | Admitting: Obstetrics and Gynecology

## 2013-11-12 ENCOUNTER — Encounter: Payer: Self-pay | Admitting: Obstetrics and Gynecology

## 2013-11-12 VITALS — BP 120/66 | HR 70 | Resp 20 | Ht 65.0 in | Wt 140.2 lb

## 2013-11-12 DIAGNOSIS — Z0000 Encounter for general adult medical examination without abnormal findings: Secondary | ICD-10-CM

## 2013-11-12 LAB — POCT URINALYSIS DIPSTICK
Bilirubin, UA: NEGATIVE
Glucose, UA: NEGATIVE
KETONES UA: NEGATIVE
Leukocytes, UA: NEGATIVE
NITRITE UA: NEGATIVE
PROTEIN UA: NEGATIVE
RBC UA: NEGATIVE
UROBILINOGEN UA: NEGATIVE
pH, UA: 5

## 2013-11-12 MED ORDER — NORETHINDRONE 0.35 MG PO TABS: 1.0000 | ORAL_TABLET | Freq: Every day | ORAL | Status: DC

## 2013-11-12 NOTE — Progress Notes (Signed)
Patient ID: Denise Jackson, female   DOB: [birth year], 43 y.o.   MRN: [id number] 43 y.o. [id number] MarriedCaucasianF here for annual exam.    On Micronor for almost 3 months.  Bleeding is less so happy about that part.  Menses last 7 - 8 days and bleeding is back to more normal. Some break through bleeding for 4 - 5 days before menses start.  Can also occur midcycle. Having some increased PMS before the menses start.  Feel more emotional.  Having more breast tenderness prior to menses.  Some headaches during menstrual week but not needing Imitrex now.   Having 5 - 7 pounds of weight gain.   Had pelvic ultrasound showing some endometrial thickening and no fibroids, and EMB was normal.  Question of a pea size area of the left breast on self exam.  Cannot find today.  Family history of breast cancer in a maternal aunt.   Night time urination.  Some sensitivity even though she just emptied.   Drinks a lot of water during the day. 1 coffee in the am.  No more tea.   Patient's last menstrual period was 11/02/2013.          Sexually active: Yes.   female The current method of family planning is OCP's--Micronor Exercising: No.  None. Smoker:  no  Health Maintenance: Pap:  11-03-11 wnl:neg HR HPV.    History of abnormal Pap:  no MMG:  11-16-12 heterogeneously dense/wnl:[facility].  Has appointment for next week.  Colonoscopy:   --- BMD:   2009 wnl with PCP--[facility] TDaP:  2012 Screening Labs: PCP, Hb today: PCP, Urine today: Neg   reports that she has never smoked. She has never used smokeless tobacco. She reports that she does not drink alcohol or use illicit drugs.  Past Medical History  Diagnosis Date  . IBS (irritable bowel syndrome)   . Migraine   . Inner ear disease     Past Surgical History  Procedure Laterality Date  . Nasal sinus surgery  1988, 1990  . Nasal septum surgery  1988  . Wisdom tooth extraction      Current Outpatient Prescriptions  Medication Sig Dispense Refill   . acetaminophen (TYLENOL) 325 MG tablet Take 650 mg by mouth every 6 (six) hours as needed.      . ALPRAZolam (XANAX) 0.25 MG tablet Take 1 tablet by mouth as needed.      . Ascorbic Acid (VITAMIN C) 100 MG tablet Take 500 mg by mouth daily.       . calcium carbonate (OS-CAL) 600 MG TABS tablet Take 600 mg by mouth 2 (two) times daily with a meal.      . Cinnamon 500 MG capsule Take 500 mg by mouth daily.      Marland Kitchen Fexofenadine HCl (ALLEGRA ALLERGY PO) Take 1 tablet by mouth daily.      Marland Kitchen ibuprofen (ADVIL,MOTRIN) 200 MG tablet Take 200 mg by mouth every 6 (six) hours as needed.      . lactobacillus acidophilus (BACID) TABS tablet Take 2 tablets by mouth 3 (three) times daily.      . meclizine (ANTIVERT) 25 MG tablet Take 25 mg by mouth 3 (three) times daily as needed for dizziness.      . Methylcellulose, Laxative, (CITRUCEL PO) Take by mouth.      . Multiple Vitamin (MULTIVITAMIN) capsule Take 1 capsule by mouth daily.      . norethindrone (ORTHO MICRONOR) 0.35 MG tablet Take 1 tablet (0.35 mg  total) by mouth daily.  1 Package  3  . Phenylephrine HCl (AFRIN ALLERGY NA) Place 1 spray into the nose as needed.      . SUMAtriptan (IMITREX) 25 MG tablet Take 25 mg by mouth every 2 (two) hours as needed for migraine. May repeat in 2 hours if headache persists or recurs.      . Triamcinolone Acetonide (NASACORT AQ NA) Place 1 spray into the nose as needed.       No current facility-administered medications for this visit.    Family History  Problem Relation Age of Onset  . Breast cancer Maternal Aunt     also with fallopian tube CA--DEC  . Hypertension Father   . Diabetes Maternal Grandmother   . Hypertension Maternal Grandmother   . Thyroid disease Maternal Grandmother   . Hypertension Maternal Grandfather   . Stroke Maternal Grandfather   . Hypertension Paternal Grandmother   . Hypertension Paternal Grandfather     ROS:  Pertinent items are noted in HPI.  Otherwise, a comprehensive ROS  was negative.  Exam:   BP 120/66  Pulse 70  Resp 20  Ht 5\' 5"  (1.651 m)  Wt 140 lb 3.2 oz (63.594 kg)  BMI 23.33 kg/m2  LMP 11/02/2013     Height: 5\' 5"  (165.1 cm)  Ht Readings from Last 3 Encounters:  11/12/13 5\' 5"  (1.651 m)  07/29/13 5' 5.5" (1.664 m)  06/24/13 5' 5.5" (1.664 m)    General appearance: alert, cooperative and appears stated age Head: Normocephalic, without obvious abnormality, atraumatic Neck: no adenopathy, supple, symmetrical, trachea midline and thyroid normal to inspection and palpation Lungs: clear to auscultation bilaterally Breasts: normal appearance, no masses or tenderness, Inspection negative, No nipple retraction or dimpling, No nipple discharge or bleeding, No axillary or supraclavicular adenopathy Heart: regular rate and rhythm Abdomen: soft, non-tender; bowel sounds normal; no masses,  no organomegaly Extremities: extremities normal, atraumatic, no cyanosis or edema Skin: Skin color, texture, turgor normal. No rashes or lesions Lymph nodes: Cervical, supraclavicular, and axillary nodes normal. No abnormal inguinal nodes palpated Neurologic: Grossly normal   Pelvic: External genitalia:  no lesions              Urethra:  normal appearing urethra with no masses, tenderness or lesions              Bartholins and Skenes: normal                 Vagina: normal appearing vagina with normal color and discharge, no lesions              Cervix: no lesions              Pap taken: No. Bimanual Exam:  Uterus:  normal size, contour, position, consistency, mobility, non-tender              Adnexa: normal adnexa and no mass, fullness, tenderness               Rectovaginal: Confirms               Anus:  normal sphincter tone, no lesions  A:  Well Woman with normal exam Menorrhagia controlled on Micronor but having side effects.  P:   Mammogram yearly.  Patient will schedule. Discussed guidelines from different organizations. pap smear not indicated.   Refilled Micronor for one year.  Discussed Mirena and endometrial ablation with BTL.  Written information to patient also.  She will consider the Mirena  IUD. return annually or prn  An After Visit Summary was printed and given to the patient.

## 2013-11-12 NOTE — Patient Instructions (Signed)
EXERCISE AND DIET:  We recommended that you start or continue a regular exercise program for good health. Regular exercise means any activity that makes your heart beat faster and makes you sweat.  We recommend exercising at least 30 minutes per day at least 3 days a week, preferably 4 or 5.  We also recommend a diet low in fat and sugar.  Inactivity, poor dietary choices and obesity can cause diabetes, heart attack, stroke, and kidney damage, among others.    ALCOHOL AND SMOKING:  Women should limit their alcohol intake to no more than 7 drinks/beers/glasses of wine (combined, not each!) per week. Moderation of alcohol intake to this level decreases your risk of breast cancer and liver damage. And of course, no recreational drugs are part of a healthy lifestyle.  And absolutely no smoking or even second hand smoke. Most people know smoking can cause heart and lung diseases, but did you know it also contributes to weakening of your bones? Aging of your skin?  Yellowing of your teeth and nails?  CALCIUM AND VITAMIN D:  Adequate intake of calcium and Vitamin D are recommended.  The recommendations for exact amounts of these supplements seem to change often, but generally speaking 600 mg of calcium (either carbonate or citrate) and 800 units of Vitamin D per day seems prudent. Certain women may benefit from higher intake of Vitamin D.  If you are among these women, your doctor will have told you during your visit.    PAP SMEARS:  Pap smears, to check for cervical cancer or precancers,  have traditionally been done yearly, although recent scientific advances have shown that most women can have pap smears less often.  However, every woman still should have a physical exam from her gynecologist every year. It will include a breast check, inspection of the vulva and vagina to check for abnormal growths or skin changes, a visual exam of the cervix, and then an exam to evaluate the size and shape of the uterus and  ovaries.  And after 45 years of age, a rectal exam is indicated to check for rectal cancers. We will also provide age appropriate advice regarding health maintenance, like when you should have certain vaccines, screening for sexually transmitted diseases, bone density testing, colonoscopy, mammograms, etc.   MAMMOGRAMS:  All women over 40 years old should have a yearly mammogram. Many facilities now offer a "3D" mammogram, which may cost around $50 extra out of pocket. If possible,  we recommend you accept the option to have the 3D mammogram performed.  It both reduces the number of women who will be called back for extra views which then turn out to be normal, and it is better than the routine mammogram at detecting truly abnormal areas.    COLONOSCOPY:  Colonoscopy to screen for colon cancer is recommended for all women at age 45.  We know, you hate the idea of the prep.  We agree, BUT, having colon cancer and not knowing it is worse!!  Colon cancer so often starts as a polyp that can be seen and removed at colonscopy, which can quite literally save your life!  And if your first colonoscopy is normal and you have no family history of colon cancer, most women don't have to have it again for 10 years.  Once every ten years, you can do something that may end up saving your life, right?  We will be happy to help you get it scheduled when you are ready.    Be sure to check your insurance coverage so you understand how much it will cost.  It may be covered as a preventative service at no cost, but you should check your particular policy.     Levonorgestrel intrauterine device (IUD) What is this medicine? LEVONORGESTREL IUD (LEE voe nor jes trel) is a contraceptive (birth control) device. The device is placed inside the uterus by a healthcare professional. It is used to prevent pregnancy and can also be used to treat heavy bleeding that occurs during your period. Depending on the device, it can be used for 3 to 5  years. This medicine may be used for other purposes; ask your health care provider or pharmacist if you have questions. COMMON BRAND NAME(S): Verda Cumins What should I tell my health care provider before I take this medicine? They need to know if you have any of these conditions: -abnormal Pap smear -cancer of the breast, uterus, or cervix -diabetes -endometritis -genital or pelvic infection now or in the past -have more than one sexual partner or your partner has more than one partner -heart disease -history of an ectopic or tubal pregnancy -immune system problems -IUD in place -liver disease or tumor -problems with blood clots or take blood-thinners -use intravenous drugs -uterus of unusual shape -vaginal bleeding that has not been explained -an unusual or allergic reaction to levonorgestrel, other hormones, silicone, or polyethylene, medicines, foods, dyes, or preservatives -pregnant or trying to get pregnant -breast-feeding How should I use this medicine? This device is placed inside the uterus by a health care professional. Talk to your pediatrician regarding the use of this medicine in children. Special care may be needed. Overdosage: If you think you have taken too much of this medicine contact a poison control center or emergency room at once. NOTE: This medicine is only for you. Do not share this medicine with others. What if I miss a dose? This does not apply. What may interact with this medicine? Do not take this medicine with any of the following medications: -amprenavir -bosentan -fosamprenavir This medicine may also interact with the following medications: -aprepitant -barbiturate medicines for inducing sleep or treating seizures -bexarotene -griseofulvin -medicines to treat seizures like carbamazepine, ethotoin, felbamate, oxcarbazepine, phenytoin, topiramate -modafinil -pioglitazone -rifabutin -rifampin -rifapentine -some medicines to treat HIV  infection like atazanavir, indinavir, lopinavir, nelfinavir, tipranavir, ritonavir -St. John's wort -warfarin This list may not describe all possible interactions. Give your health care provider a list of all the medicines, herbs, non-prescription drugs, or dietary supplements you use. Also tell them if you smoke, drink alcohol, or use illegal drugs. Some items may interact with your medicine. What should I watch for while using this medicine? Visit your doctor or health care professional for regular check ups. See your doctor if you or your partner has sexual contact with others, becomes HIV positive, or gets a sexual transmitted disease. This product does not protect you against HIV infection (AIDS) or other sexually transmitted diseases. You can check the placement of the IUD yourself by reaching up to the top of your vagina with clean fingers to feel the threads. Do not pull on the threads. It is a good habit to check placement after each menstrual period. Call your doctor right away if you feel more of the IUD than just the threads or if you cannot feel the threads at all. The IUD may come out by itself. You may become pregnant if the device comes out. If you notice that the IUD  has come out use a backup birth control method like condoms and call your health care provider. Using tampons will not change the position of the IUD and are okay to use during your period. What side effects may I notice from receiving this medicine? Side effects that you should report to your doctor or health care professional as soon as possible: -allergic reactions like skin rash, itching or hives, swelling of the face, lips, or tongue -fever, flu-like symptoms -genital sores -high blood pressure -no menstrual period for 6 weeks during use -pain, swelling, warmth in the leg -pelvic pain or tenderness -severe or sudden headache -signs of pregnancy -stomach cramping -sudden shortness of breath -trouble with  balance, talking, or walking -unusual vaginal bleeding, discharge -yellowing of the eyes or skin Side effects that usually do not require medical attention (report to your doctor or health care professional if they continue or are bothersome): -acne -breast pain -change in sex drive or performance -changes in weight -cramping, dizziness, or faintness while the device is being inserted -headache -irregular menstrual bleeding within first 3 to 6 months of use -nausea This list may not describe all possible side effects. Call your doctor for medical advice about side effects. You may report side effects to FDA at 1-800-FDA-1088. Where should I keep my medicine? This does not apply. NOTE: This sheet is a summary. It may not cover all possible information. If you have questions about this medicine, talk to your doctor, pharmacist, or health care provider.  2015, Elsevier/Gold Standard. (2011-02-07 13:54:04)  Endometrial Ablation Endometrial ablation removes the lining of the uterus (endometrium). It is usually a same-day, outpatient treatment. Ablation helps avoid major surgery, such as surgery to remove the cervix and uterus (hysterectomy). After endometrial ablation, you will have little or no menstrual bleeding and may not be able to have children. However, if you are premenopausal, you will need to use a reliable method of birth control following the procedure because of the small chance that pregnancy can occur. There are different reasons to have this procedure, which include:  Heavy periods.  Bleeding that is causing anemia.  Irregular bleeding.  Bleeding fibroids on the lining inside the uterus if they are smaller than 3 centimeters. This procedure should not be done if:  You want children in the future.  You have severe cramps with your menstrual period.  You have precancerous or cancerous cells in your uterus.  You were recently pregnant.  You have gone through  menopause.  You have had major surgery on the uterus, such as a cesarean delivery. LET [facility]OUR HEALTH CARE PROVIDER KNOW ABOUT:  Any allergies you have.  All medicines you are taking, including vitamins, herbs, eye drops, creams, and over-the-counter medicines.  Previous problems you or members of your family have had with the use of anesthetics.  Any blood disorders you have.  Previous surgeries you have had.  Medical conditions you have. RISKS AND COMPLICATIONS  Generally, this is a safe procedure. However, as with any procedure, complications can occur. Possible complications include:  Perforation of the uterus.  Bleeding.  Infection of the uterus, bladder, or vagina.  Injury to surrounding organs.  An air bubble to the lung (air embolus).  Pregnancy following the procedure.  Failure of the procedure to help the problem, requiring hysterectomy.  Decreased ability to diagnose cancer in the lining of the uterus. BEFORE THE PROCEDURE  The lining of the uterus must be tested to make sure there is no pre-cancerous or cancer  cells present.  An ultrasound may be performed to look at the size of the uterus and to check for abnormalities.  Medicines may be given to thin the lining of the uterus. PROCEDURE  During the procedure, your health care provider will use a tool called a resectoscope to help see inside your uterus. There are different ways to remove the lining of your uterus.   Radiofrequency - This method uses a radiofrequency-alternating electric current to remove the lining of the uterus.  Cryotherapy - This method uses extreme cold to freeze the lining of the uterus.  Heated-Free Liquid - This method uses heated salt (saline) solution to remove the lining of the uterus.  Microwave - This method uses high-energy microwaves to heat up the lining of the uterus to remove it.  Thermal balloon - This method involves inserting a catheter with a balloon tip into the  uterus. The balloon tip is filled with heated fluid to remove the lining of the uterus. AFTER THE PROCEDURE  After your procedure, do not have sexual intercourse or insert anything into your vagina until permitted by your health care provider. After the procedure, you may experience:  Cramps.  Vaginal discharge.  Frequent urination. Document Released: 11/17/2003 Document Revised: 09/09/2012 Document Reviewed: 06/10/2012 [facility] Patient Information 2015 [employer] [place]. This information is not intended to replace advice given to you by your health care provider. Make sure you discuss any questions you have with your health care provider.

## 2013-11-16 ENCOUNTER — Telehealth: Payer: Self-pay | Admitting: Obstetrics and Gynecology

## 2013-11-16 DIAGNOSIS — Z30430 Encounter for insertion of intrauterine contraceptive device: Secondary | ICD-10-CM

## 2013-11-16 NOTE — Telephone Encounter (Signed)
Patient want to check on the expected out of pocket cost for Mirena.

## 2013-11-18 NOTE — Telephone Encounter (Signed)
Denise LeeSabrina, patient calling for information on Mirena IUD. Can you perform  insurance pre certification and return call to patient?       Cc Dr. Edward JollySilva.

## 2013-11-18 NOTE — Telephone Encounter (Signed)
**  grandfathered plan Copay:  Deductible: $3009 ($173.86 met) OOP: same as ded Coins: 100% after ded/oop PAC: n/a Ref# [id number] Allowed amount: $991.57   PR: $991.57

## 2013-11-18 NOTE — Telephone Encounter (Signed)
Left message for patient to call back.  Patient is covered under a grandfathered plan. Contraception is not covered at 100% but it is subject to ded/coins. Pr $991.57

## 2013-11-18 NOTE — Telephone Encounter (Signed)
Patient returned call. Advised of benefits for IUD. Patient states that she has met the deductible despite the insurance company saying that she has not. Checked the patients deductible at [employer]. Per [employer], the patients deductible has been met.  I advised that patient that I would contact [place] again for clarity and would call her back. Patient agreeable.

## 2013-11-19 ENCOUNTER — Telehealth: Payer: Self-pay | Admitting: Obstetrics and Gynecology

## 2013-11-19 NOTE — Telephone Encounter (Signed)
Patient is ready to schedule Mirena insertion.

## 2013-11-19 NOTE — Telephone Encounter (Signed)
Spoke with patient. Advised that per another conversation with her insurance company it has been confirmed that her family deductible and coinsurance has been satisfied. Services will now be covered at 100% of allowable. Patient is to call with cycle to schedule IUD insertion. Patient agreeable.

## 2013-11-19 NOTE — Telephone Encounter (Signed)
Return call to patient.  Last OV discussed change from Micronor to Mirena and patient is ready to proceed. Instructed to take Mortin 800 mg 1 hour prior with food. Brief discussion on insertion procedure. Currently on Micronor and has irregular unpredictable cycles, appointment scheduled for 12-22-13 independent of cycle. Recheck scheduled for 01-19-14 (scheduled now for insurance purposes.) Advised provider will review call and if has additional instructions regarding insertion date.

## 2013-11-21 NOTE — Telephone Encounter (Signed)
I agree with the plan.  If doing insertion at time that is outside of cycle, will do a quantitative beta HCG and then insert IUD 2 days later provided the test is negative.  Patient to abstain from sexual activity during that time between blood work and IUD insertion.  Do not stop Micronor until after IUD inserted.   Thanks!

## 2013-11-22 ENCOUNTER — Encounter: Payer: Self-pay | Admitting: Obstetrics and Gynecology

## 2013-11-22 NOTE — Telephone Encounter (Signed)
Patient notified of Dr Silva's messRica Recordsage. Pt verbalized understanding. Made lab appt. 12/20/13 @10 :00am

## 2013-11-22 NOTE — Telephone Encounter (Signed)
Message left to return call to [place] at [phone].   Will need lab appointment and message from Dr. Edward JollySilva.

## 2013-11-22 NOTE — Telephone Encounter (Signed)
Returning a call to Tracy °

## 2013-11-25 ENCOUNTER — Other Ambulatory Visit: Payer: Self-pay | Admitting: Obstetrics & Gynecology

## 2013-11-26 NOTE — Telephone Encounter (Signed)
Last AEX 11/12/13 Last [facility] 11/12/13 [facility] [place]

## 2013-11-29 ENCOUNTER — Telehealth: Payer: Self-pay | Admitting: Obstetrics and Gynecology

## 2013-11-29 NOTE — Telephone Encounter (Signed)
AEX 11/12/13 with Dr. Edward JollySilva #1/11 rfs [facility] [place] [facility]  [facility] they do not have that prescription. Called in a verbal order for Norethindrone 0.35 mg #1/11 rfs.  Patient notified.  Routed to provider for review, encounter closed.

## 2013-11-29 NOTE — Telephone Encounter (Signed)
Patient calling to request refills on Denise Jackson. She says the pharmacy was supposed to have the RX but they don't.  [facility] [id number] - [place], [place] - [address] AT [address]

## 2013-12-20 ENCOUNTER — Other Ambulatory Visit: Payer: Self-pay

## 2013-12-20 ENCOUNTER — Other Ambulatory Visit (INDEPENDENT_AMBULATORY_CARE_PROVIDER_SITE_OTHER): Payer: BC Managed Care – PPO

## 2013-12-20 DIAGNOSIS — Z30014 Encounter for initial prescription of intrauterine contraceptive device: Secondary | ICD-10-CM

## 2013-12-21 LAB — HCG, QUANTITATIVE, PREGNANCY

## 2013-12-22 ENCOUNTER — Ambulatory Visit (INDEPENDENT_AMBULATORY_CARE_PROVIDER_SITE_OTHER): Payer: BC Managed Care – PPO | Admitting: Obstetrics and Gynecology

## 2013-12-22 ENCOUNTER — Encounter: Payer: Self-pay | Admitting: Obstetrics and Gynecology

## 2013-12-22 DIAGNOSIS — Z30430 Encounter for insertion of intrauterine contraceptive device: Secondary | ICD-10-CM

## 2013-12-22 HISTORY — PX: INTRAUTERINE DEVICE (IUD) INSERTION: SHX5877

## 2013-12-22 NOTE — Progress Notes (Signed)
GYNECOLOGY  VISIT   HPI: 43 y.o.   Married  Caucasian  female   [phone] with Patient's last menstrual period was 11/26/2013 (approximate).   here for   IUD Insertion of Mirena.  Has had break through bleeding on Micronor.  Negative endometrial biopsy.  Ultrasouns showing thickened endometrium - menses was about to start.   Quant beta HCG negative 12/20/13. GYNECOLOGIC HISTORY: Patient's last menstrual period was 11/26/2013 (approximate). Contraception:   Micronor Menopausal hormone therapy: None        OB History    Gravida Para Term Preterm AB TAB SAB Ectopic Multiple Living   3 3 3       3          Patient Active Problem List   Diagnosis Date Noted  . Menorrhagia with irregular cycle 07/29/2013    Past Medical History  Diagnosis Date  . IBS (irritable bowel syndrome)   . Migraine   . Inner ear disease     Past Surgical History  Procedure Laterality Date  . Nasal sinus surgery  1988, 1990  . Nasal septum surgery  1988  . Wisdom tooth extraction      Current Outpatient Prescriptions  Medication Sig Dispense Refill  . acetaminophen (TYLENOL) 325 MG tablet Take 650 mg by mouth every 6 (six) hours as needed.    . ALPRAZolam (XANAX) 0.25 MG tablet Take 1 tablet by mouth as needed.    . Ascorbic Acid (VITAMIN C) 100 MG tablet Take 500 mg by mouth daily.     . calcium carbonate (OS-CAL) 600 MG TABS tablet Take 600 mg by mouth 2 (two) times daily with a meal.    . Cinnamon 500 MG capsule Take 500 mg by mouth daily.    Marland Kitchen. Fexofenadine HCl (ALLEGRA ALLERGY PO) Take 1 tablet by mouth daily.    Marland Kitchen. ibuprofen (ADVIL,MOTRIN) 200 MG tablet Take 200 mg by mouth every 6 (six) hours as needed.    . lactobacillus acidophilus (BACID) TABS tablet Take 2 tablets by mouth 3 (three) times daily.    . meclizine (ANTIVERT) 25 MG tablet Take 25 mg by mouth 3 (three) times daily as needed for dizziness.    . Methylcellulose, Laxative, (CITRUCEL PO) Take by mouth.    . Multiple Vitamin  (MULTIVITAMIN) capsule Take 1 capsule by mouth daily.    . norethindrone (ORTHO MICRONOR) 0.35 MG tablet Take 1 tablet (0.35 mg total) by mouth daily. 1 Package 11  . Phenylephrine HCl (AFRIN ALLERGY NA) Place 1 spray into the nose as needed.    . SUMAtriptan (IMITREX) 25 MG tablet Take 25 mg by mouth every 2 (two) hours as needed for migraine. May repeat in 2 hours if headache persists or recurs.    . Triamcinolone Acetonide (NASACORT AQ NA) Place 1 spray into the nose as needed.     No current facility-administered medications for this visit.     ALLERGIES: Review of patient's allergies indicates no known allergies.  Family History  Problem Relation Age of Onset  . Breast cancer Maternal Aunt     also with fallopian tube CA--DEC  . Hypertension Father   . Diabetes Maternal Grandmother   . Hypertension Maternal Grandmother   . Thyroid disease Maternal Grandmother   . Hypertension Maternal Grandfather   . Stroke Maternal Grandfather   . Hypertension Paternal Grandmother   . Hypertension Paternal Grandfather     History   Social History  . Marital Status: Married    Spouse Name:  Ephriam KnucklesMike Coward    Number of Children: 3  . Years of Education: College   Occupational History  .      [employer]   Social History Main Topics  . Smoking status: Never Smoker   . Smokeless tobacco: Never Used  . Alcohol Use: No  . Drug Use: No  . Sexual Activity:    Partners: Male    Birth Control/ Protection: OCP, Pill     Comment: Arna Mediciora B   Other Topics Concern  . Not on file   Social History Narrative   Patient lives at home with her family.   Caffeine Use: 1-2 cups daily    ROS:  Pertinent items are noted in HPI.  PHYSICAL EXAMINATION:    BP 128/80 mmHg  Pulse 76  Resp 16  Ht 5\' 5"  (1.651 m)  Wt 140 lb (63.504 kg)  BMI 23.30 kg/m2  LMP 11/26/2013 (Approximate)     General appearance: alert, cooperative and appears stated age   Pelvic: External genitalia:  no lesions               Urethra:  normal appearing urethra with no masses, tenderness or lesions              Bartholins and Skenes: normal                 Vagina: normal appearing vagina with normal color and discharge, no lesions              Cervix: normal appearance                   Bimanual Exam:  Uterus:  uterus is normal size, shape, consistency and nontender                                      Adnexa: normal adnexa in size, nontender and no masses                                       Mirena IUD insertion - Lot number [id number], Expiration 04/18  Consent for procedure.  Speculum placed in vagina.  Sterile prep of cervix with Hibiclens.  Tenaculum to anterior cervical lip.  Uterus sounded to 8 cm.  Mirena placed without difficulty.  Strings trimmed.  No complications.  Minimal EBL.  Repeat bimanual exam - no change.   ASSESSMENT  Mirena IUD insertion.   PLAN  Instructions and precautions given.  Return in 4 weeks for IUD check   An After Visit Summary was printed and given to the patient.  __15____ minutes face to face time of which over 50% was spent in counseling.

## 2013-12-22 NOTE — Patient Instructions (Signed)

## 2014-01-19 ENCOUNTER — Encounter: Payer: Self-pay | Admitting: Obstetrics and Gynecology

## 2014-01-19 ENCOUNTER — Ambulatory Visit (INDEPENDENT_AMBULATORY_CARE_PROVIDER_SITE_OTHER): Payer: BC Managed Care – PPO | Admitting: Obstetrics and Gynecology

## 2014-01-19 VITALS — BP 108/70 | HR 72 | Resp 18 | Ht 65.0 in | Wt 144.0 lb

## 2014-01-19 DIAGNOSIS — Z30431 Encounter for routine checking of intrauterine contraceptive device: Secondary | ICD-10-CM

## 2014-01-19 NOTE — Progress Notes (Signed)
GYNECOLOGY  VISIT   HPI: 43 y.o.   Married  Caucasian  female   [phone] with Patient's last menstrual period was 12/24/2013.   here for IUD Check   Mirena IUD inserted 12/22/13.  Menses started 2 days after IUD inserted.  Menses drom 12/4 - 12/12. Then had trace bleeding following this and then started to have one episode on 12/15 of more heavy bleeding that started and stopped abruptly.  Lower pelvic achiness when has menses only.   GYNECOLOGIC HISTORY: Patient's last menstrual period was 12/24/2013. Contraception:  IUD  Menopausal hormone therapy: None        OB History    Gravida Para Term Preterm AB TAB SAB Ectopic Multiple Living   3 3 3       3          Patient Active Problem List   Diagnosis Date Noted  . Menorrhagia with irregular cycle 07/29/2013    Past Medical History  Diagnosis Date  . IBS (irritable bowel syndrome)   . Migraine   . Inner ear disease     Past Surgical History  Procedure Laterality Date  . Nasal sinus surgery  1988, 1990  . Nasal septum surgery  1988  . Wisdom tooth extraction    . Intrauterine device (iud) insertion  12/22/13    Mirena    Current Outpatient Prescriptions  Medication Sig Dispense Refill  . acetaminophen (TYLENOL) 325 MG tablet Take 650 mg by mouth every 6 (six) hours as needed.    . ALPRAZolam (XANAX) 0.25 MG tablet Take 1 tablet by mouth as needed.    . Ascorbic Acid (VITAMIN C) 100 MG tablet Take 500 mg by mouth daily.     . calcium carbonate (OS-CAL) 600 MG TABS tablet Take 600 mg by mouth 2 (two) times daily with a meal.    . Cinnamon 500 MG capsule Take 500 mg by mouth daily.    Marland Kitchen. Fexofenadine HCl (ALLEGRA ALLERGY PO) Take 1 tablet by mouth daily.    Marland Kitchen. ibuprofen (ADVIL,MOTRIN) 200 MG tablet Take 200 mg by mouth every 6 (six) hours as needed.    . lactobacillus acidophilus (BACID) TABS tablet Take 2 tablets by mouth 3 (three) times daily.    . meclizine (ANTIVERT) 25 MG tablet Take 25 mg by mouth 3 (three) times daily  as needed for dizziness.    . Methylcellulose, Laxative, (CITRUCEL PO) Take by mouth.    . Multiple Vitamin (MULTIVITAMIN) capsule Take 1 capsule by mouth daily.    Marland Kitchen. Phenylephrine HCl (AFRIN ALLERGY NA) Place 1 spray into the nose as needed.    . SUMAtriptan (IMITREX) 25 MG tablet Take 25 mg by mouth every 2 (two) hours as needed for migraine. May repeat in 2 hours if headache persists or recurs.    . Triamcinolone Acetonide (NASACORT AQ NA) Place 1 spray into the nose as needed.     No current facility-administered medications for this visit.     ALLERGIES: Review of patient's allergies indicates no known allergies.  Family History  Problem Relation Age of Onset  . Breast cancer Maternal Aunt     also with fallopian tube CA--DEC  . Hypertension Father   . Diabetes Maternal Grandmother   . Hypertension Maternal Grandmother   . Thyroid disease Maternal Grandmother   . Hypertension Maternal Grandfather   . Stroke Maternal Grandfather   . Hypertension Paternal Grandmother   . Hypertension Paternal Grandfather     History   Social History  .  Marital Status: Married    Spouse Name: Ephriam KnucklesMike Pickron    Number of Children: 3  . Years of Education: College   Occupational History  .      [employer]   Social History Main Topics  . Smoking status: Never Smoker   . Smokeless tobacco: Never Used  . Alcohol Use: No  . Drug Use: No  . Sexual Activity:    Partners: Male    Birth Control/ Protection: OCP, IUD     Comment: Mirena Insertion 12/22/13   Other Topics Concern  . Not on file   Social History Narrative   Patient lives at home with her family.   Caffeine Use: 1-2 cups daily    ROS:  Pertinent items are noted in HPI.  PHYSICAL EXAMINATION:    BP 108/70 mmHg  Pulse 72  Resp 18  Ht 5\' 5"  (1.651 m)  Wt 144 lb (65.318 kg)  BMI 23.96 kg/m2  LMP 12/24/2013     General appearance: alert, cooperative and appears stated age    Pelvic: External genitalia:  no  lesions              Urethra:  normal appearing urethra with no masses, tenderness or lesions              Bartholins and Skenes: normal                 Vagina: normal appearing vagina with normal color and discharge, no lesions              Cervix: normal appearance.  IUD strings noted.                    Bimanual Exam:  Uterus:  uterus is normal size, shape, consistency and nontender                                      Adnexa: normal adnexa in size, nontender and no masses                                     ASSESSMENT  Mirena IUD patient.    PLAN  Reassurance regarding bleeding profile.  Keep bleeding calendar so we can review it if needed in the future.  Reassured regarding contraceptive benefit of the IUD.  Return prn and for annual exam.     An After Visit Summary was printed and given to the patient.  __15____ minutes face to face time of which over 50% was spent in counseling.

## 2014-11-23 ENCOUNTER — Ambulatory Visit (INDEPENDENT_AMBULATORY_CARE_PROVIDER_SITE_OTHER): Payer: BLUE CROSS/BLUE SHIELD | Admitting: Obstetrics and Gynecology

## 2014-11-23 ENCOUNTER — Encounter: Payer: Self-pay | Admitting: Obstetrics and Gynecology

## 2014-11-23 VITALS — BP 142/82 | HR 70 | Resp 70 | Ht 65.5 in | Wt 151.0 lb

## 2014-11-23 DIAGNOSIS — Z01419 Encounter for gynecological examination (general) (routine) without abnormal findings: Secondary | ICD-10-CM | POA: Diagnosis not present

## 2014-11-23 DIAGNOSIS — Z0000 Encounter for general adult medical examination without abnormal findings: Secondary | ICD-10-CM | POA: Diagnosis not present

## 2014-11-23 LAB — POCT URINALYSIS DIPSTICK
Bilirubin, UA: NEGATIVE
Blood, UA: NEGATIVE
GLUCOSE UA: NEGATIVE
KETONES UA: NEGATIVE
LEUKOCYTES UA: NEGATIVE
Nitrite, UA: NEGATIVE
PROTEIN UA: NEGATIVE
Urobilinogen, UA: NEGATIVE
pH, UA: 6

## 2014-11-23 NOTE — Progress Notes (Signed)
Patient ID: Denise Jackson, female   DOB: 01-20-1971, 44 y.o.   MRN: [id number] 44 y.o. [id number] Married Caucasian female here for annual exam.    Has Mirena IUD.  Menses are every 21 - 28 days.  Last 7 - 14 days including lingering spotting.  Changes pad or tampon up to every 3 - 4 hours. Has the IUD to treat heavy bleeding.  Has trace bleeding on day 16 - 20 as well.  Can have midcycle aching also and in July this was heavy bleeding.  Bleeding is better overall according to patient.   Pelvic ultrasound 06/24/13  No myometrial masses. EMS 15.04 mm with cystic area. Right ovary normal. Left ovary with 17 mm CL cyst.  No free fluid.   EMB - benign - on 07/29/13.  Has vaginal itching prior to her cycle starting.  Hx of migraine with aura.  PCP: Tyson Dense, MD  - labs with Dr. Eula Listen last month - all normal.   Patient's last menstrual period was 11/20/2014 (exact date).          Sexually active: Yes.   female The current method of family planning is IUD--Mirena inserted 12-22-13.    Exercising: No.   Smoker:  no  Health Maintenance: Pap:  11-02-12 Neg:Neg HR HPV History of abnormal Pap:  no MMG:  11-02-14 3D New Lt.Breast nodule with an indistinct margin;Diag.Lt.mammogram performed and Neg/BiRads1:[facility]--return to screening. Colonoscopy:  n/a BMD:   n/a  Result  n/a TDaP:  PCP Screening Labs:  Hb today: PCP, Urine today: Neg   reports that she has never smoked. She has never used smokeless tobacco. She reports that she does not drink alcohol or use illicit drugs.  Past Medical History  Diagnosis Date  . IBS (irritable bowel syndrome)   . Migraine   . Inner ear disease     Past Surgical History  Procedure Laterality Date  . Nasal sinus surgery  1988, 1990  . Nasal septum surgery  1988  . Wisdom tooth extraction    . Intrauterine device (iud) insertion  12/22/13    Mirena    Current Outpatient Prescriptions  Medication Sig Dispense Refill  . acetaminophen (TYLENOL)  325 MG tablet Take 650 mg by mouth every 6 (six) hours as needed.    . ALPRAZolam (XANAX) 0.25 MG tablet Take 1 tablet by mouth as needed.    . Ascorbic Acid (VITAMIN C) 100 MG tablet Take 500 mg by mouth daily.     . calcium carbonate (OS-CAL) 600 MG TABS tablet Take 600 mg by mouth 2 (two) times daily with a meal.    . Cinnamon 500 MG capsule Take 500 mg by mouth daily.    Marland Kitchen Fexofenadine HCl (ALLEGRA ALLERGY PO) Take 1 tablet by mouth as needed.     Marland Kitchen ibuprofen (ADVIL,MOTRIN) 200 MG tablet Take 200 mg by mouth every 6 (six) hours as needed.    . lactobacillus acidophilus (BACID) TABS tablet Take 2 tablets by mouth 3 (three) times daily.    . meclizine (ANTIVERT) 25 MG tablet Take 25 mg by mouth 3 (three) times daily as needed for dizziness.    . Methylcellulose, Laxative, (CITRUCEL PO) Take by mouth as needed.     . Multiple Vitamin (MULTIVITAMIN) capsule Take 1 capsule by mouth daily.    . SUMAtriptan (IMITREX) 25 MG tablet Take 25 mg by mouth every 2 (two) hours as needed for migraine. May repeat in 2 hours if headache persists or recurs.    Marland Kitchen  dicyclomine (BENTYL) 20 MG tablet Take 1 tablet by mouth as needed.  5   No current facility-administered medications for this visit.    Family History  Problem Relation Age of Onset  . Breast cancer Maternal Aunt     also with fallopian tube CA--DEC  . Hypertension Father   . Diabetes Maternal Grandmother   . Hypertension Maternal Grandmother   . Thyroid disease Maternal Grandmother   . Hypertension Maternal Grandfather   . Stroke Maternal Grandfather   . Hypertension Paternal Grandmother   . Hypertension Paternal Grandfather     ROS:  Pertinent items are noted in HPI.  Otherwise, a comprehensive ROS was negative.  Exam:   BP 142/82 mmHg  Pulse 70  Resp 70  Ht 5' 5.5" (1.664 m)  Wt 151 lb (68.493 kg)  BMI 24.74 kg/m2  LMP 11/20/2014 (Exact Date)    General appearance: alert, cooperative and appears stated age Head: Normocephalic,  without obvious abnormality, atraumatic Neck: no adenopathy, supple, symmetrical, trachea midline and thyroid normal to inspection and palpation Lungs: clear to auscultation bilaterally Breasts: normal appearance, no masses or tenderness, Inspection negative, No nipple retraction or dimpling, No nipple discharge or bleeding, No axillary or supraclavicular adenopathy Heart: regular rate and rhythm Abdomen: soft, non-tender; bowel sounds normal; no masses,  no organomegaly Extremities: extremities normal, atraumatic, no cyanosis or edema Skin: Skin color, texture, turgor normal. No rashes or lesions Lymph nodes: Cervical, supraclavicular, and axillary nodes normal. No abnormal inguinal nodes palpated Neurologic: Grossly normal  Pelvic: External genitalia:  no lesions              Urethra:  normal appearing urethra with no masses, tenderness or lesions              Bartholins and Skenes: normal                 Vagina: normal appearing vagina with normal color and discharge, no lesions              Cervix: no lesions and IUD strings seen.  Small amount of menstrual flow.              Pap taken: Yes.   Bimanual Exam:  Uterus:  normal size, contour, position, consistency, mobility, non-tender              Adnexa: normal adnexa and no mass, fullness, tenderness              Rectovaginal: Yes.  .  Confirms.              Anus:  normal sphincter tone, no lesions  Chaperone was present for exam.  Assessment:   Well woman visit with normal exam. Mirena IUD patient.  Menses improved but still with some prolonged cycles.  Work up negative. Hx of migraine with aura.   Plan: Yearly mammogram recommended after age 740.  Recommended self breast exam.  Pap and HR HPV as above. Discussed Calcium, Vitamin D, regular exercise program including cardiovascular and weight bearing exercise. Labs performed.  No..     Refills given on medications.  No..   Call if midcycle heavy bleeding recurs.  Follow up  annually and prn.      After visit summary provided.

## 2014-11-23 NOTE — Patient Instructions (Signed)

## 2014-11-29 LAB — IPS PAP TEST WITH HPV

## 2015-11-01 DIAGNOSIS — Z1231 Encounter for screening mammogram for malignant neoplasm of breast: Secondary | ICD-10-CM | POA: Diagnosis not present

## 2015-11-02 DIAGNOSIS — Z1389 Encounter for screening for other disorder: Secondary | ICD-10-CM | POA: Diagnosis not present

## 2015-11-02 DIAGNOSIS — Z0000 Encounter for general adult medical examination without abnormal findings: Secondary | ICD-10-CM | POA: Diagnosis not present

## 2015-11-14 ENCOUNTER — Encounter: Payer: Self-pay | Admitting: Obstetrics and Gynecology

## 2015-11-24 DIAGNOSIS — L988 Other specified disorders of the skin and subcutaneous tissue: Secondary | ICD-10-CM | POA: Diagnosis not present

## 2015-11-24 DIAGNOSIS — L308 Other specified dermatitis: Secondary | ICD-10-CM | POA: Diagnosis not present

## 2015-11-24 DIAGNOSIS — L578 Other skin changes due to chronic exposure to nonionizing radiation: Secondary | ICD-10-CM | POA: Diagnosis not present

## 2015-11-24 DIAGNOSIS — L738 Other specified follicular disorders: Secondary | ICD-10-CM | POA: Diagnosis not present

## 2015-11-24 DIAGNOSIS — D485 Neoplasm of uncertain behavior of skin: Secondary | ICD-10-CM | POA: Diagnosis not present

## 2015-12-01 DIAGNOSIS — L905 Scar conditions and fibrosis of skin: Secondary | ICD-10-CM | POA: Diagnosis not present

## 2015-12-01 DIAGNOSIS — D485 Neoplasm of uncertain behavior of skin: Secondary | ICD-10-CM | POA: Diagnosis not present

## 2015-12-25 NOTE — Progress Notes (Signed)
45 y.o. [id number] Married Caucasian female here for annual exam.    Has Mirena for irregular and heavy menses. Still has menses that last 7 - 8 days but not heavy.  Every now and then has pelvic aching and then has spotting.  Had one period where she passed a clot one day after stopping cycles. Overall feels satisfied with her bleeding profile.  Has external itching the week prior to her menses.  Labs with PCP.  Told by PCP to work on her weight.   PCP:  Tyson DenseKarrar Hussain, MD   Patient's last menstrual period was 12/03/2015.           Sexually active: Yes.    The current method of family planning is IUD--Mirena inserted 12-22-13.    Exercising: Yes.    walking occasionally  Smoker:  no  Health Maintenance: Pap:  11-23-14 Neg:Neg HR HPV History of abnormal Pap:  no MMG:  11-01-15 3D/Neg/ BiRads1:[facility] Colonoscopy:  n/a BMD:   n/a  Result  n/a TDaP:  PCP Gardasil:   N/A Hep C:  NA Screening Labs:  Hb today: PCP, Urine today:  Neg.   reports that she has never smoked. She has never used smokeless tobacco. She reports that she does not drink alcohol or use drugs.  Past Medical History:  Diagnosis Date  . IBS (irritable bowel syndrome)   . Inner ear disease   . Migraine     Past Surgical History:  Procedure Laterality Date  . INTRAUTERINE DEVICE (IUD) INSERTION  12/22/13   Mirena  . NASAL SEPTUM SURGERY  1988  . NASAL SINUS SURGERY  1988, 1990  . WISDOM TOOTH EXTRACTION      Current Outpatient Prescriptions  Medication Sig Dispense Refill  . acetaminophen (TYLENOL) 325 MG tablet Take 650 mg by mouth every 6 (six) hours as needed.    . ALPRAZolam (XANAX) 0.25 MG tablet Take 1 tablet by mouth as needed.    . Ascorbic Acid (VITAMIN C) 100 MG tablet Take 500 mg by mouth daily.     . calcium carbonate (OS-CAL) 600 MG TABS tablet Take 600 mg by mouth 2 (two) times daily with a meal.    . Cinnamon 500 MG capsule Take 500 mg by mouth daily.    . Coenzyme Q10 (CO Q-10 PO) Take 1  tablet by mouth daily.    . Cyanocobalamin (B-12) 500 MCG TABS Take 1 tablet by mouth daily.    Marland Kitchen. dicyclomine (BENTYL) 20 MG tablet Take 1 tablet by mouth as needed.  5  . Fexofenadine HCl (ALLEGRA ALLERGY PO) Take 1 tablet by mouth as needed.     Marland Kitchen. ibuprofen (ADVIL,MOTRIN) 200 MG tablet Take 200 mg by mouth every 6 (six) hours as needed.    . lactobacillus acidophilus (BACID) TABS tablet Take 2 tablets by mouth 3 (three) times daily.    . meclizine (ANTIVERT) 25 MG tablet Take 25 mg by mouth 3 (three) times daily as needed for dizziness.    . Methylcellulose, Laxative, (CITRUCEL PO) Take by mouth as needed.     . Multiple Vitamin (MULTIVITAMIN) capsule Take 1 capsule by mouth daily.    . SUMAtriptan (IMITREX) 25 MG tablet Take 25 mg by mouth every 2 (two) hours as needed for migraine. May repeat in 2 hours if headache persists or recurs.     No current facility-administered medications for this visit.     Family History  Problem Relation Age of Onset  . Hypertension Father   .  Breast cancer Maternal Aunt     also with fallopian tube CA--DEC  . Diabetes Maternal Grandmother   . Hypertension Maternal Grandmother   . Thyroid disease Maternal Grandmother   . Hypertension Maternal Grandfather   . Stroke Maternal Grandfather   . Hypertension Paternal Grandmother   . Hypertension Paternal Grandfather     ROS:  Pertinent items are noted in HPI.  Otherwise, a comprehensive ROS was negative.  Exam:   BP 124/68 (BP Location: Right Arm, Patient Position: Sitting, Cuff Size: Normal)   Pulse 88   Resp 16   Ht 5' 5.25" (1.657 m)   Wt 160 lb (72.6 kg)   LMP 12/03/2015   BMI 26.42 kg/m     General appearance: alert, cooperative and appears stated age Head: Normocephalic, without obvious abnormality, atraumatic Neck: no adenopathy, supple, symmetrical, trachea midline and thyroid normal to inspection and palpation Lungs: clear to auscultation bilaterally Breasts: normal appearance, no  masses or tenderness, No nipple retraction or dimpling, No nipple discharge or bleeding, No axillary or supraclavicular adenopathy Heart: regular rate and rhythm Abdomen: soft, non-tender; no masses, no organomegaly Extremities: extremities normal, atraumatic, no cyanosis or edema Skin: Skin color, texture, turgor normal. No rashes or lesions Lymph nodes: Cervical, supraclavicular, and axillary nodes normal. No abnormal inguinal nodes palpated Neurologic: Grossly normal  Pelvic: External genitalia:  no lesions              Urethra:  normal appearing urethra with no masses, tenderness or lesions              Bartholins and Skenes: normal                 Vagina: normal appearing vagina with normal color and discharge, no lesions              Cervix: no lesions.  IUD strings seen.               Pap taken: No. Bimanual Exam:  Uterus:  normal size, contour, position, consistency, mobility, non-tender              Adnexa: no mass, fullness, tenderness              Rectal exam: Yes.  .  Confirms.              Anus:  normal sphincter tone, no lesions  Chaperone was present for exam.  Assessment:   Well woman visit with normal exam. Irregular menses.  Mirena IUD patient.   Plan: Yearly mammogram recommended after age 47.  Recommended self breast exam.  Pap and HR HPV as above. Discussed Calcium, Vitamin D, regular exercise program including cardiovascular and weight bearing exercise. Return for pelvic ultrasound.  Follow up annually and prn.      After visit summary provided.

## 2015-12-27 ENCOUNTER — Ambulatory Visit (INDEPENDENT_AMBULATORY_CARE_PROVIDER_SITE_OTHER): Payer: BLUE CROSS/BLUE SHIELD | Admitting: Obstetrics and Gynecology

## 2015-12-27 ENCOUNTER — Encounter: Payer: Self-pay | Admitting: Obstetrics and Gynecology

## 2015-12-27 VITALS — BP 124/68 | HR 88 | Resp 16 | Ht 65.25 in | Wt 160.0 lb

## 2015-12-27 DIAGNOSIS — Z0000 Encounter for general adult medical examination without abnormal findings: Secondary | ICD-10-CM | POA: Diagnosis not present

## 2015-12-27 DIAGNOSIS — Z30431 Encounter for routine checking of intrauterine contraceptive device: Secondary | ICD-10-CM

## 2015-12-27 DIAGNOSIS — N926 Irregular menstruation, unspecified: Secondary | ICD-10-CM

## 2015-12-27 DIAGNOSIS — Z01419 Encounter for gynecological examination (general) (routine) without abnormal findings: Secondary | ICD-10-CM | POA: Diagnosis not present

## 2015-12-27 LAB — POCT URINALYSIS DIPSTICK
Bilirubin, UA: NEGATIVE
Blood, UA: NEGATIVE
Glucose, UA: NEGATIVE
Ketones, UA: NEGATIVE
Leukocytes, UA: NEGATIVE
Nitrite, UA: NEGATIVE
Protein, UA: NEGATIVE
Urobilinogen, UA: NEGATIVE
pH, UA: 7

## 2015-12-27 NOTE — Patient Instructions (Signed)

## 2015-12-28 ENCOUNTER — Telehealth: Payer: Self-pay | Admitting: Obstetrics and Gynecology

## 2015-12-28 NOTE — Telephone Encounter (Signed)
Called patient to review benefits for a recommended procedure. Left Voicemail requesting a call back. °

## 2015-12-28 NOTE — Telephone Encounter (Signed)
Patient returned call. Spoke with patient regarding benefit for ultrasound. Patient understood and agreeable. Patient ready to schedule. Patient scheduled 01/04/16 with Dr Edward JollySilva. Patient aware of  Date, arrival time and cancellation policy. No further questions. Ok to close

## 2016-01-04 ENCOUNTER — Encounter: Payer: Self-pay | Admitting: Obstetrics and Gynecology

## 2016-01-04 ENCOUNTER — Ambulatory Visit (INDEPENDENT_AMBULATORY_CARE_PROVIDER_SITE_OTHER): Payer: BLUE CROSS/BLUE SHIELD | Admitting: Obstetrics and Gynecology

## 2016-01-04 ENCOUNTER — Ambulatory Visit (INDEPENDENT_AMBULATORY_CARE_PROVIDER_SITE_OTHER): Payer: BLUE CROSS/BLUE SHIELD

## 2016-01-04 VITALS — BP 122/70 | HR 70 | Resp 16 | Ht 65.25 in | Wt 158.0 lb

## 2016-01-04 DIAGNOSIS — Z30431 Encounter for routine checking of intrauterine contraceptive device: Secondary | ICD-10-CM | POA: Diagnosis not present

## 2016-01-04 DIAGNOSIS — N920 Excessive and frequent menstruation with regular cycle: Secondary | ICD-10-CM

## 2016-01-04 DIAGNOSIS — N926 Irregular menstruation, unspecified: Secondary | ICD-10-CM | POA: Diagnosis not present

## 2016-01-04 NOTE — Progress Notes (Signed)
GYNECOLOGY  VISIT   HPI: 45 y.o.   Married  Caucasian  female   [phone] with Patient's last menstrual period was 12/26/2015 (exact date).   here for pelvic ultrasound.    Menses 7 - 8 days but not heavy.  Can have pelvic aching and then spotting.  This can occur midcycle. Occasional clot passes after cycle ends.   Menstrual bleeding profile overall is much better since having IUD placed 2 years ago.   GYNECOLOGIC HISTORY: Patient's last menstrual period was 12/26/2015 (exact date). Contraception: IUD Menopausal hormone therapy: n/a Last mammogram:  11-01-15 neg birads 1 Last pap smear:   11-23-14 neg HPV HR neg        OB History    Gravida Para Term Preterm AB Living   3 3 3     3    SAB TAB Ectopic Multiple Live Births                     Patient Active Problem List   Diagnosis Date Noted  . Menorrhagia with irregular cycle 07/29/2013    Past Medical History:  Diagnosis Date  . IBS (irritable bowel syndrome)   . Inner ear disease   . Migraine     Past Surgical History:  Procedure Laterality Date  . INTRAUTERINE DEVICE (IUD) INSERTION  12/22/13   Mirena  . NASAL SEPTUM SURGERY  1988  . NASAL SINUS SURGERY  1988, 1990  . WISDOM TOOTH EXTRACTION      Current Outpatient Prescriptions  Medication Sig Dispense Refill  . acetaminophen (TYLENOL) 325 MG tablet Take 650 mg by mouth every 6 (six) hours as needed.    . ALPRAZolam (XANAX) 0.25 MG tablet Take 1 tablet by mouth as needed.    . Ascorbic Acid (VITAMIN C) 100 MG tablet Take 500 mg by mouth daily.     . calcium carbonate (OS-CAL) 600 MG TABS tablet Take 600 mg by mouth 2 (two) times daily with a meal.    . Cinnamon 500 MG capsule Take 500 mg by mouth daily.    . Coenzyme Q10 (CO Q-10 PO) Take 1 tablet by mouth daily.    . Cyanocobalamin (B-12) 500 MCG TABS Take 1 tablet by mouth daily.    Marland Kitchen. dicyclomine (BENTYL) 20 MG tablet Take 1 tablet by mouth as needed.  5  . Fexofenadine HCl (ALLEGRA ALLERGY PO) Take 1  tablet by mouth as needed.     Marland Kitchen. ibuprofen (ADVIL,MOTRIN) 200 MG tablet Take 200 mg by mouth every 6 (six) hours as needed.    . meclizine (ANTIVERT) 25 MG tablet Take 25 mg by mouth 3 (three) times daily as needed for dizziness.    . Methylcellulose, Laxative, (CITRUCEL PO) Take by mouth as needed.     . Multiple Vitamin (MULTIVITAMIN) capsule Take 1 capsule by mouth daily.    . SUMAtriptan (IMITREX) 25 MG tablet Take 25 mg by mouth every 2 (two) hours as needed for migraine. May repeat in 2 hours if headache persists or recurs.    . lactobacillus acidophilus (BACID) TABS tablet Take 2 tablets by mouth 3 (three) times daily.     No current facility-administered medications for this visit.      ALLERGIES: Patient has no known allergies.  Family History  Problem Relation Age of Onset  . Hypertension Father   . Breast cancer Maternal Aunt     also with fallopian tube CA--DEC  . Diabetes Maternal Grandmother   . Hypertension Maternal  Grandmother   . Thyroid disease Maternal Grandmother   . Hypertension Maternal Grandfather   . Stroke Maternal Grandfather   . Hypertension Paternal Grandmother   . Hypertension Paternal Grandfather     Social History   Social History  . Marital status: Married    Spouse name: Ephriam KnucklesMike Vora  . Number of children: 3  . Years of education: College   Occupational History  .  [employer]    [employer]   Social History Main Topics  . Smoking status: Never Smoker  . Smokeless tobacco: Never Used  . Alcohol use No  . Drug use: No  . Sexual activity: Yes    Partners: Male    Birth control/ protection: IUD     Comment: Mirena Insertion 12/22/13   Other Topics Concern  . Not on file   Social History Narrative   Patient lives at home with her family.   Caffeine Use: 1-2 cups daily    ROS:  Pertinent items are noted in HPI.  PHYSICAL EXAMINATION:    BP 122/70   Pulse 70   Resp 16   Ht 5' 5.25" (1.657 m)   Wt 158 lb (71.7 kg)    LMP 12/26/2015 (Exact Date)   BMI 26.09 kg/m     General appearance: alert, cooperative and appears stated age  Uterus with IUD in canal.  No myometrial masses.  EMS 2.72 mm.  Symmetrical and with no abnormal blood flow. Ovaries normal.  No free fluid.    ASSESSMENT  Mirena IUD patient. Menstrual spotting.  This may be ovulatory bleeding. Hx migraines.   PLAN  Reassurance regarding pelvic anatomy and IUD position and bleeding profile. Discussed Mirena, Depo Provera, endometrial ablation, and hysterectomy for control of menstrual bleeding.  Will continue Mirena IUD. Follow up for annual exam and prn.    An After Visit Summary was printed and given to the patient.  __15____ minutes face to face time of which over 50% was spent in counseling.

## 2016-01-10 IMAGING — MR MR HEAD WO/W CM
9 of 10 series · 36 of 48 positions shown · non-contrast
Comparison: none

[Series 2: t1_se_sag · sagittal · 5.0mm · 0.45mm/px · 1 of 21 slices shown]
[im 1/21]
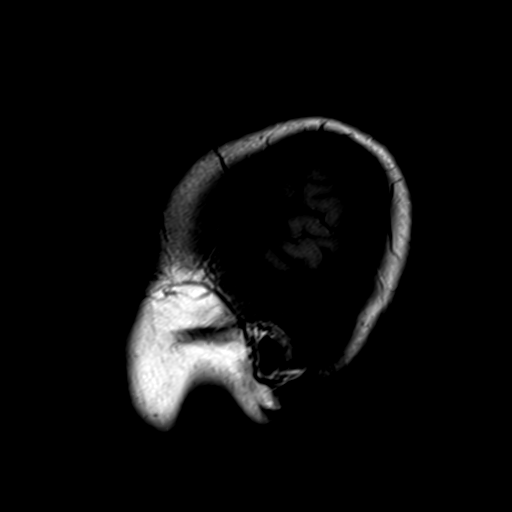

[Series 3: ep2d_diff_(id)_trace · axial · 5.0mm · 1.80mm/px · z∈[-88,+54]mm · 5 of 44 slices shown]
[im 1/44]
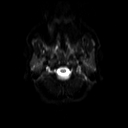
[im 11/44]
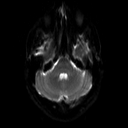
[im 22/44]
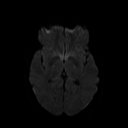
[im 33/44]
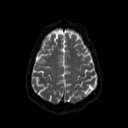
[im 44/44]
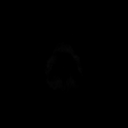

[Series 4: ep2d_diff_(id)_trace_adc · axial · 5.0mm · 1.80mm/px · z∈[-88,+54]mm · 3 of 22 slices shown]
[im 1/22]
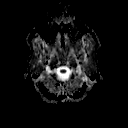
[im 11/22]
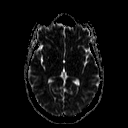
[im 22/22]
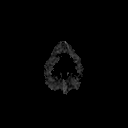

[Series 6: swi_images · axial · 2.0mm · 0.90mm/px · z∈[-95,+62]mm · 8 of 80 slices shown]
[im 1/80]
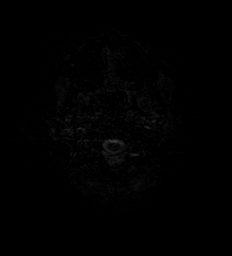
[im 10/80]
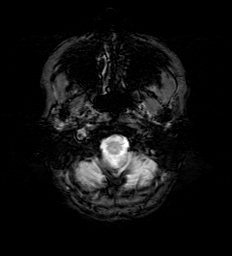
[im 20/80]
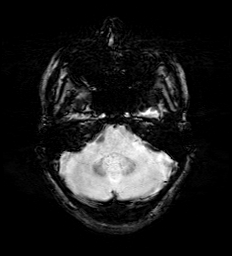
[im 30/80]
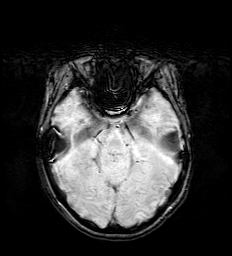
[im 50/80]
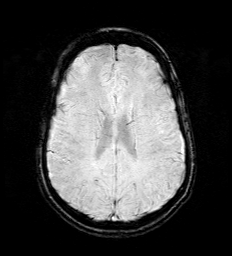
[im 60/80]
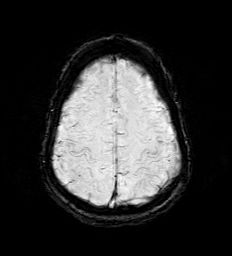
[im 70/80]
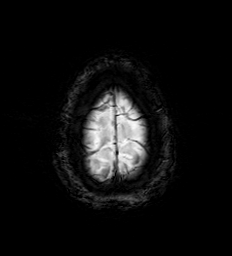
[im 80/80]
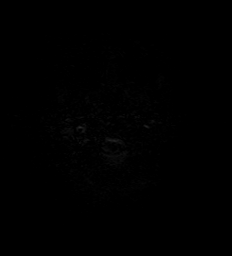

[Series 7: FLAIR · axial · 5.0mm · 0.45mm/px · z∈[-88,+54]mm · 3 of 22 slices shown]
[im 1/22]
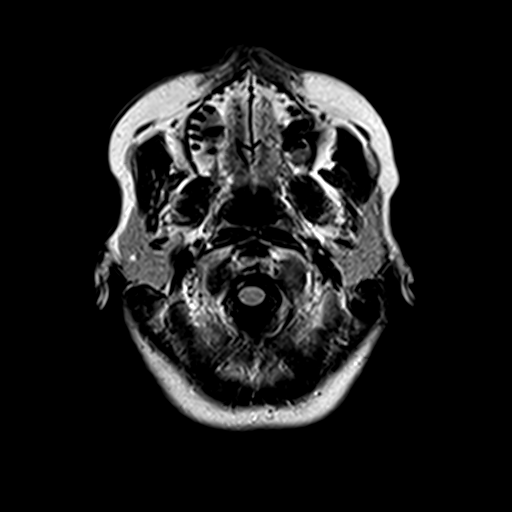
[im 11/22]
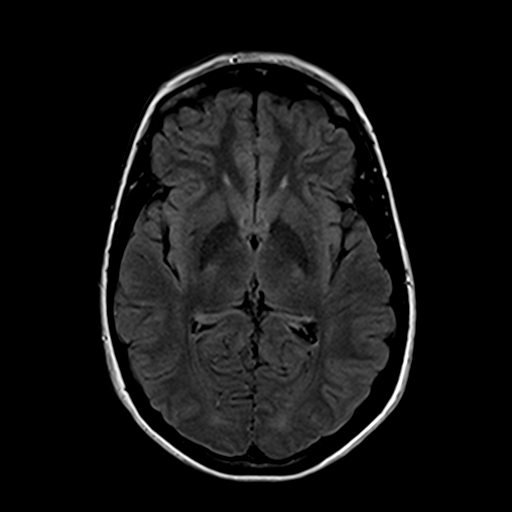
[im 22/22]
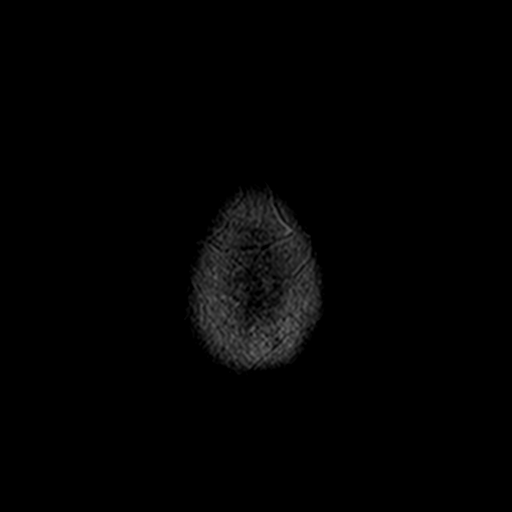

[Series 8: t2_tse_tra_512 · axial · 5.0mm · 0.60mm/px · z∈[-87,+55]mm · 3 of 22 slices shown]
[im 1/22]
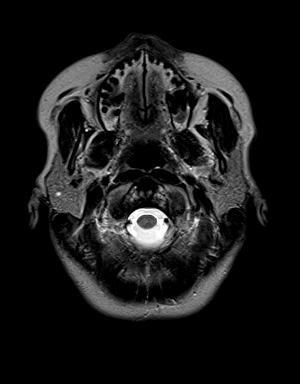
[im 11/22]
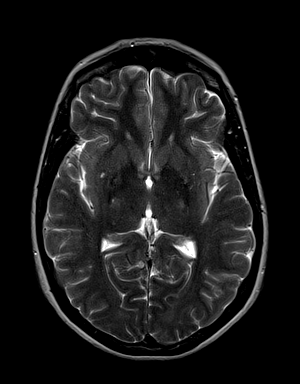
[im 22/22]
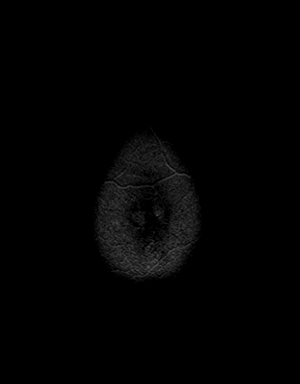

[Series 9: t1_mpr_tra · axial · 2.0mm · 0.45mm/px · z∈[-95,+63]mm · 8 of 80 slices shown]
[im 1/80]
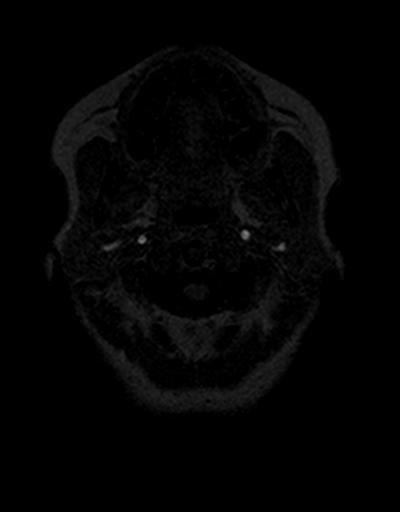
[im 10/80]
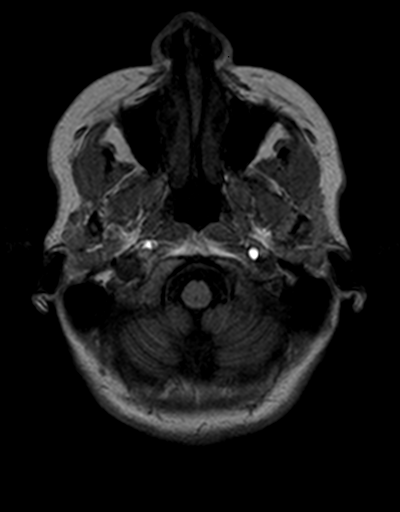
[im 20/80]
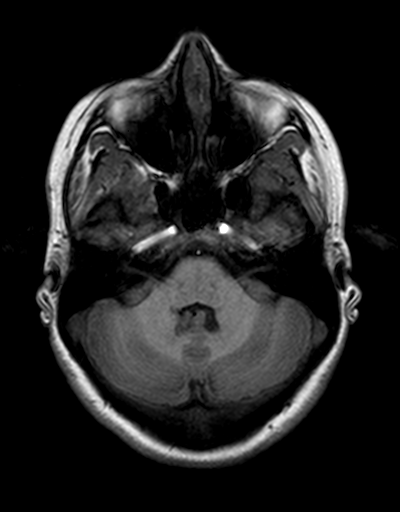
[im 30/80]
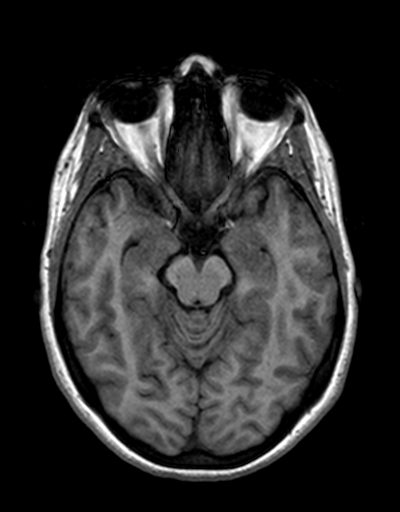
[im 50/80]
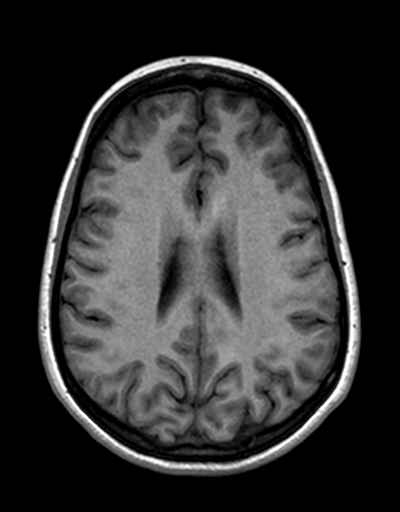
[im 60/80]
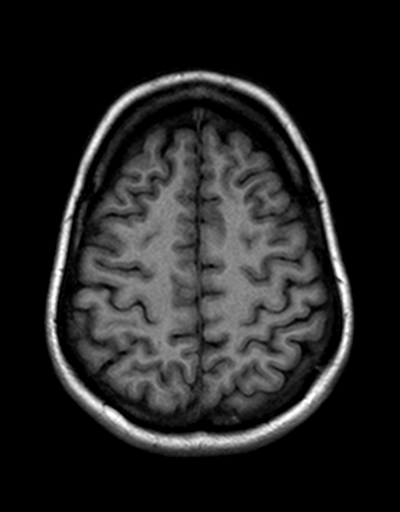
[im 70/80]
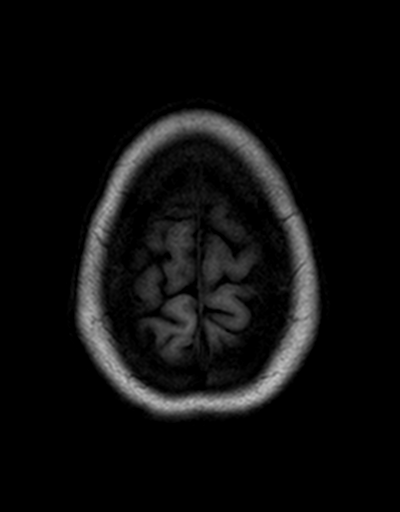
[im 80/80]
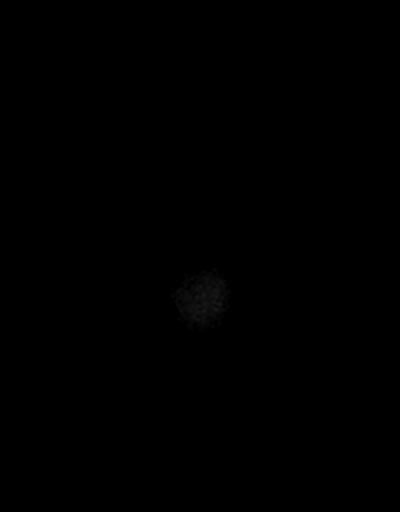

[Series 10: T2 · coronal · 5.0mm · 0.45mm/px · 3 of 25 slices shown]
[im 1/25]
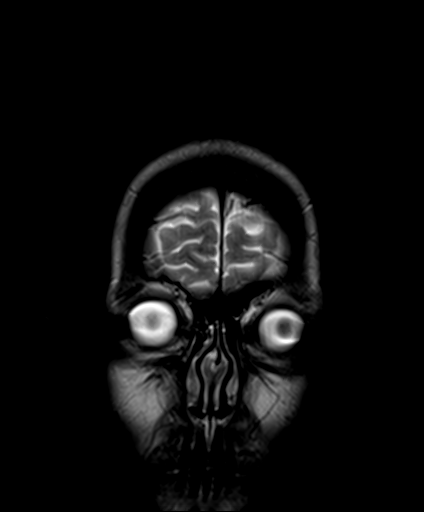
[im 13/25]
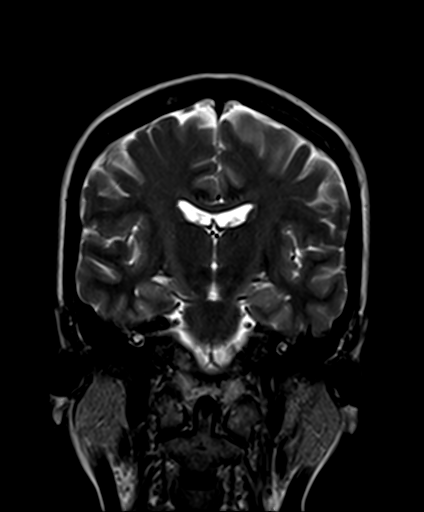
[im 25/25]
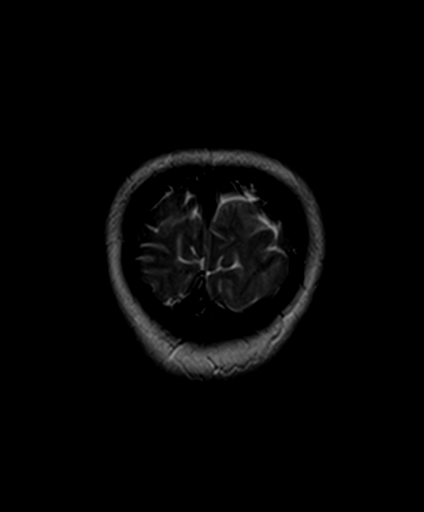

[Series 11: post cor · coronal · 5.0mm · 0.45mm/px · 2 of 25 slices shown]
[im 1/25]
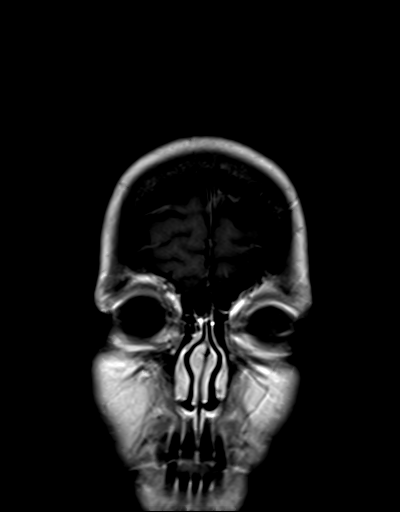
[im 13/25]
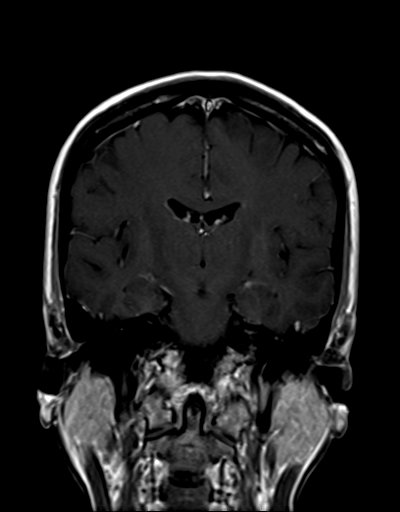

[36 of 48 positions shown; findings below may reference images not displayed]

CLINICAL DATA
Episodes of dizziness for 3 months.

EXAM
MRI HEAD WITHOUT AND WITH CONTRAST

TECHNIQUE
Multiplanar, multiecho pulse sequences of the brain and surrounding
structures were obtained without and with intravenous contrast.

CONTRAST
12mL MULTIHANCE GADOBENATE DIMEGLUMINE 529 MG/ML IV SOLN

COMPARISON
CT SINUS LTD W/O CM dated 01/01/2008

FINDINGS
There is not no evidence of acute infarct or intracranial
hemorrhage. There are a few punctate foci of T2 hyperintensities in
the subcortical cerebral white matter bilaterally, predominantly in
the frontal lobes and nonspecific. There is no evidence of mass,
midline shift, or extra-axial fluid collection. There is no abnormal
enhancement. Ventricles and sulci are normal for age.

Orbits are unremarkable. Paranasal sinuses and mastoid air cells are
clear. Major intracranial vascular flow voids are preserved.

IMPRESSION
1. No evidence of acute intracranial abnormality or mass.
2. A few punctate foci of white matter T2 signal abnormality. This
is nonspecific, with considerations including minimal chronic small
vessel disease, sequelae of migraines, and prior trauma or
infection, among others.

SIGNATURE

## 2016-03-14 DIAGNOSIS — M7711 Lateral epicondylitis, right elbow: Secondary | ICD-10-CM | POA: Diagnosis not present

## 2016-11-06 ENCOUNTER — Encounter: Payer: Self-pay | Admitting: Obstetrics and Gynecology

## 2016-11-06 DIAGNOSIS — Z1231 Encounter for screening mammogram for malignant neoplasm of breast: Secondary | ICD-10-CM | POA: Diagnosis not present

## 2016-11-13 DIAGNOSIS — Z1389 Encounter for screening for other disorder: Secondary | ICD-10-CM | POA: Diagnosis not present

## 2016-11-13 DIAGNOSIS — Z136 Encounter for screening for cardiovascular disorders: Secondary | ICD-10-CM | POA: Diagnosis not present

## 2016-11-13 DIAGNOSIS — Z0000 Encounter for general adult medical examination without abnormal findings: Secondary | ICD-10-CM | POA: Diagnosis not present

## 2017-01-08 ENCOUNTER — Ambulatory Visit: Payer: BLUE CROSS/BLUE SHIELD | Admitting: Obstetrics and Gynecology

## 2017-02-19 ENCOUNTER — Ambulatory Visit: Payer: BLUE CROSS/BLUE SHIELD | Admitting: Obstetrics and Gynecology

## 2017-02-19 ENCOUNTER — Encounter: Payer: Self-pay | Admitting: Obstetrics and Gynecology

## 2017-02-19 ENCOUNTER — Other Ambulatory Visit: Payer: Self-pay

## 2017-02-19 ENCOUNTER — Other Ambulatory Visit (HOSPITAL_COMMUNITY)
Admission: RE | Admit: 2017-02-19 | Discharge: 2017-02-19 | Disposition: A | Discharge status: Home / Self Care | Payer: BLUE CROSS/BLUE SHIELD | Source: Ambulatory Visit | Attending: Obstetrics and Gynecology | Admitting: Obstetrics and Gynecology

## 2017-02-19 VITALS — BP 132/80 | HR 76 | Resp 16 | Ht 65.5 in | Wt 159.0 lb

## 2017-02-19 DIAGNOSIS — Z01419 Encounter for gynecological examination (general) (routine) without abnormal findings: Secondary | ICD-10-CM | POA: Insufficient documentation

## 2017-02-19 DIAGNOSIS — R87610 Atypical squamous cells of undetermined significance on cytologic smear of cervix (ASC-US): Secondary | ICD-10-CM | POA: Diagnosis not present

## 2017-02-19 MED ORDER — ESTRADIOL 0.05 MG/24HR TD PTTW: 1.0000 | MEDICATED_PATCH | TRANSDERMAL | 3 refills | Status: DC

## 2017-02-19 NOTE — Patient Instructions (Addendum)
EXERCISE AND DIET:  We recommended that you start or continue a regular exercise program for good health. Regular exercise means any activity that makes your heart beat faster and makes you sweat.  We recommend exercising at least 30 minutes per day at least 3 days a week, preferably 4 or 5.  We also recommend a diet low in fat and sugar.  Inactivity, poor dietary choices and obesity can cause diabetes, heart attack, stroke, and kidney damage, among others.    ALCOHOL AND SMOKING:  Women should limit their alcohol intake to no more than 7 drinks/beers/glasses of wine (combined, not each!) per week. Moderation of alcohol intake to this level decreases your risk of breast cancer and liver damage. And of course, no recreational drugs are part of a healthy lifestyle.  And absolutely no smoking or even second hand smoke. Most people know smoking can cause heart and lung diseases, but did you know it also contributes to weakening of your bones? Aging of your skin?  Yellowing of your teeth and nails?  CALCIUM AND VITAMIN D:  Adequate intake of calcium and Vitamin D are recommended.  The recommendations for exact amounts of these supplements seem to change often, but generally speaking 600 mg of calcium (either carbonate or citrate) and 800 units of Vitamin D per day seems prudent. Certain women may benefit from higher intake of Vitamin D.  If you are among these women, your doctor will have told you during your visit.    PAP SMEARS:  Pap smears, to check for cervical cancer or precancers,  have traditionally been done yearly, although recent scientific advances have shown that most women can have pap smears less often.  However, every woman still should have a physical exam from her gynecologist every year. It will include a breast check, inspection of the vulva and vagina to check for abnormal growths or skin changes, a visual exam of the cervix, and then an exam to evaluate the size and shape of the uterus and  ovaries.  And after 49 years of age, a rectal exam is indicated to check for rectal cancers. We will also provide age appropriate advice regarding health maintenance, like when you should have certain vaccines, screening for sexually transmitted diseases, bone density testing, colonoscopy, mammograms, etc.   MAMMOGRAMS:  All women over 40 years old should have a yearly mammogram. Many facilities now offer a "3D" mammogram, which may cost around $50 extra out of pocket. If possible,  we recommend you accept the option to have the 3D mammogram performed.  It both reduces the number of women who will be called back for extra views which then turn out to be normal, and it is better than the routine mammogram at detecting truly abnormal areas.    COLONOSCOPY:  Colonoscopy to screen for colon cancer is recommended for all women at age 50.  We know, you hate the idea of the prep.  We agree, BUT, having colon cancer and not knowing it is worse!!  Colon cancer so often starts as a polyp that can be seen and removed at colonscopy, which can quite literally save your life!  And if your first colonoscopy is normal and you have no family history of colon cancer, most women don't have to have it again for 10 years.  Once every ten years, you can do something that may end up saving your life, right?  We will be happy to help you get it scheduled when you are ready.    Be sure to check your insurance coverage so you understand how much it will cost.  It may be covered as a preventative service at no cost, but you should check your particular policy.     kegels Kegel Exercises Kegel exercises help strengthen the muscles that support the rectum, vagina, small intestine, bladder, and uterus. Doing Kegel exercises can help:  Improve bladder and bowel control.  Improve sexual response.  Reduce problems and discomfort during pregnancy.  Kegel exercises involve squeezing your pelvic floor muscles, which are the same muscles  you squeeze when you try to stop the flow of urine. The exercises can be done while sitting, standing, or lying down, but it is best to vary your position. Phase 1 exercises 1. Squeeze your pelvic floor muscles tight. You should feel a tight lift in your rectal area. If you are a female, you should also feel a tightness in your vaginal area. Keep your stomach, buttocks, and legs relaxed. 2. Hold the muscles tight for up to 10 seconds. 3. Relax your muscles. Repeat this exercise 50 times a day or as many times as told by your health care provider. Continue to do this exercise for at least 4-6 weeks or for as long as told by your health care provider. This information is not intended to replace advice given to you by your health care provider. Make sure you discuss any questions you have with your health care provider. Document Released: 12/25/2011 Document Revised: 09/02/2015 Document Reviewed: 11/27/2014 Elsevier Interactive Patient Education  [employer]2018 Elsevier Inc[employer]

## 2017-02-19 NOTE — Progress Notes (Addendum)
47 y.o. [id number] Married Caucasian female here for annual exam.    Discouraged with her weight.   No hot flashes but does have some night sweats.  Menses are regular every month.  Occur every 24 - 28 days and last 7 - 10 days.  Having light flow.  First day is crampy and achy.  Has menstrual migraines and controls with Imitrex.   ROS:  Occasional headache and rare loss of urine with couth/sneeze.  Labs with PCP.  PCP:   Dr. Donette LarryHusain   Patient's last menstrual period was 02/13/2017.           Sexually active: Yes.    The current method of family planning is IUD--Mirena inserted 12-22-13.    Exercising: No.  The patient does not participate in regular exercise at present. Smoker:  no  Health Maintenance: Pap:   11-23-14 Neg:Neg HR HPV History of abnormal Pap:  no MMG:  11/06/2016 - BIRADS 1 negative/density c - 3D Colonoscopy:  n/a BMD:  Years ago per patient TDaP:  PCP Gardasil:   n/a HIV: In pregnancy. Hep C:  NA. Screening Labs:  PCP   reports that  has never smoked. she has never used smokeless tobacco. She reports that she does not drink alcohol or use drugs.  Past Medical History:  Diagnosis Date  . IBS (irritable bowel syndrome)   . Inner ear disease   . Migraine     Past Surgical History:  Procedure Laterality Date  . INTRAUTERINE DEVICE (IUD) INSERTION  12/22/13   Mirena  . NASAL SEPTUM SURGERY  1988  . NASAL SINUS SURGERY  1988, 1990  . WISDOM TOOTH EXTRACTION      Current Outpatient Medications  Medication Sig Dispense Refill  . acetaminophen (TYLENOL) 325 MG tablet Take 650 mg by mouth every 6 (six) hours as needed.    . ALPRAZolam (XANAX) 0.25 MG tablet Take 1 tablet by mouth as needed.    . Ascorbic Acid (VITAMIN C) 100 MG tablet Take 500 mg by mouth daily.     . calcium carbonate (OS-CAL) 600 MG TABS tablet Take 600 mg by mouth 2 (two) times daily with a meal.    . Cinnamon 500 MG capsule Take 500 mg by mouth daily.    . Coenzyme Q10 (CO Q-10 PO)  Take 1 tablet by mouth daily.    . Cyanocobalamin (B-12) 500 MCG TABS Take 1 tablet by mouth daily.    Marland Kitchen. dicyclomine (BENTYL) 20 MG tablet Take 1 tablet by mouth as needed.  5  . Fexofenadine HCl (ALLEGRA ALLERGY PO) Take 1 tablet by mouth as needed.     Marland Kitchen. ibuprofen (ADVIL,MOTRIN) 200 MG tablet Take 200 mg by mouth every 6 (six) hours as needed.    . lactobacillus acidophilus (BACID) TABS tablet Take 2 tablets by mouth 3 (three) times daily.    . meclizine (ANTIVERT) 25 MG tablet Take 25 mg by mouth 3 (three) times daily as needed for dizziness.    . Methylcellulose, Laxative, (CITRUCEL PO) Take by mouth as needed.     . Multiple Vitamin (MULTIVITAMIN) capsule Take 1 capsule by mouth daily.    . SUMAtriptan (IMITREX) 25 MG tablet Take 25 mg by mouth every 2 (two) hours as needed for migraine. May repeat in 2 hours if headache persists or recurs.     No current facility-administered medications for this visit.     Family History  Problem Relation Age of Onset  . Hypertension Father   .  Breast cancer Maternal Aunt        also with fallopian tube CA--DEC  . Diabetes Maternal Grandmother   . Hypertension Maternal Grandmother   . Thyroid disease Maternal Grandmother   . Hypertension Maternal Grandfather   . Stroke Maternal Grandfather   . Hypertension Paternal Grandmother   . Hypertension Paternal Grandfather     ROS:  Pertinent items are noted in HPI.  Otherwise, a comprehensive ROS was negative.  Exam:   BP 132/80 (BP Location: Right Arm, Patient Position: Sitting, Cuff Size: Normal)   Pulse 76   Resp 16   Ht 5' 5.5" (1.664 m)   Wt 159 lb (72.1 kg)   LMP 02/13/2017   BMI 26.06 kg/m     General appearance: alert, cooperative and appears stated age Head: Normocephalic, without obvious abnormality, atraumatic Neck: no adenopathy, supple, symmetrical, trachea midline and thyroid normal to inspection and palpation Lungs: clear to auscultation bilaterally Breasts: normal appearance,  no masses or tenderness, No nipple retraction or dimpling, No nipple discharge or bleeding, No axillary or supraclavicular adenopathy Heart: regular rate and rhythm Abdomen: soft, non-tender; no masses, no organomegaly Extremities: extremities normal, atraumatic, no cyanosis or edema Skin: Skin color, texture, turgor normal. No rashes or lesions Lymph nodes: Cervical, supraclavicular, and axillary nodes normal. No abnormal inguinal nodes palpated Neurologic: Grossly normal  Pelvic: External genitalia:  no lesions              Urethra:  normal appearing urethra with no masses, tenderness or lesions              Bartholins and Skenes: normal                 Vagina: normal appearing vagina with normal color and discharge, no lesions              Cervix: no lesions.  IUD strings noted.  Minimal menstrual flow.              Pap taken: Yes.   Bimanual Exam:  Uterus:  normal size, contour, position, consistency, mobility, non-tender              Adnexa: no mass, fullness, tenderness              Rectal exam: Yes.  .  Confirms.              Anus:  normal sphincter tone, no lesions  Chaperone was present for exam.  Assessment:   Well woman visit with normal exam. Mirena IUD patient.  Menstrual migraines.  Mild stress incontinence.  Plan: Mammogram screening discussed. Recommended self breast awareness. Pap and HR HPV as above. Guidelines for Calcium, Vitamin D, regular exercise program including cardiovascular and weight bearing exercise. Vivelle dot 0.5 mg twice weekly during menses.  Discussed potential risks of breast cancer, DVT, PE, MI, and stroke.   She will let me know how it is working for her. Discussed colonoscopy.  Kegels and weight loss reviewed. Follow up annually and prn.   After visit summary provided.

## 2017-02-26 LAB — CYTOLOGY - PAP
DIAGNOSIS: NEGATIVE
HPV (WINDOPATH): NOT DETECTED

## 2017-11-12 DIAGNOSIS — Z1231 Encounter for screening mammogram for malignant neoplasm of breast: Secondary | ICD-10-CM | POA: Diagnosis not present

## 2017-11-19 DIAGNOSIS — Z0000 Encounter for general adult medical examination without abnormal findings: Secondary | ICD-10-CM | POA: Diagnosis not present

## 2017-11-19 DIAGNOSIS — Z13220 Encounter for screening for lipoid disorders: Secondary | ICD-10-CM | POA: Diagnosis not present

## 2018-03-02 NOTE — Progress Notes (Signed)
48 y.o. [id number] Married Caucasian female here for annual exam.    Still having menses.  Has Mirena IUD to treat heavy menses. Some hot flashes at night but not every night.  Feels like she is not sleeping enough.  Sleeps 4 - 5 hours, going to bed late and getting up early.  She can sleep, but she is not choosing to do so. Gaining weight.  Lost 30 pounds in the past with [employer].  Has Vivelle Dot for menstrual migraines, and this did not help.  Has mild urinary incontinence.  PCP:  Georgann HousekeeperKarrar Husain, MD   Patient's last menstrual period was 02/03/2018 (exact date).     Period Cycle (Days): 28 Period Duration (Days): 7-12 Period Pattern: Regular Menstrual Flow: Light Menstrual Control: Tampon Dysmenorrhea: (!) Mild(mild on first day) Dysmenorrhea Symptoms: Headache     Sexually active: Yes.    The current method of family planning is IUD-Mirena 12-22-13.    Exercising: No.  The patient does not participate in regular exercise at present. Smoker:  no  Health Maintenance: Pap: 02-19-17 Neg:Neg HR HPV, 11-23-14 Neg:Neg HR HPV History of abnormal Pap:  no MMG: 10/2017 Normal--called for current MMG Colonoscopy:  n/a BMD: Years ago--normal TDaP:  PCP Gardasil:   no HIV:in Pregnancy Hep C: n/a Screening Labs:  Hb today: PCP   reports that she has never smoked. She has never used smokeless tobacco. She reports that she does not drink alcohol or use drugs.  Past Medical History:  Diagnosis Date  . IBS (irritable bowel syndrome)   . Inner ear disease   . Migraine     Past Surgical History:  Procedure Laterality Date  . INTRAUTERINE DEVICE (IUD) INSERTION  12/22/13   Mirena  . NASAL SEPTUM SURGERY  1988  . NASAL SINUS SURGERY  1988, 1990  . WISDOM TOOTH EXTRACTION      Current Outpatient Medications  Medication Sig Dispense Refill  . acetaminophen (TYLENOL) 325 MG tablet Take 650 mg by mouth every 6 (six) hours as needed.    . ALPRAZolam (XANAX) 0.25 MG tablet Take 1  tablet by mouth as needed.    . Ascorbic Acid (VITAMIN C) 100 MG tablet Take 500 mg by mouth daily.     . calcium carbonate (OS-CAL) 600 MG TABS tablet Take 600 mg by mouth 2 (two) times daily with a meal.    . Cinnamon 500 MG capsule Take 500 mg by mouth daily.    . Coenzyme Q10 (CO Q-10 PO) Take 1 tablet by mouth daily.    . Cyanocobalamin (B-12) 500 MCG TABS Take 1 tablet by mouth daily.    Marland Kitchen. dicyclomine (BENTYL) 20 MG tablet Take 1 tablet by mouth as needed.  5  . Fexofenadine HCl (ALLEGRA ALLERGY PO) Take 1 tablet by mouth as needed.     Marland Kitchen. ibuprofen (ADVIL,MOTRIN) 200 MG tablet Take 200 mg by mouth every 6 (six) hours as needed.    . lactobacillus acidophilus (BACID) TABS tablet Take 2 tablets by mouth 3 (three) times daily.    . meclizine (ANTIVERT) 25 MG tablet Take 25 mg by mouth 3 (three) times daily as needed for dizziness.    . Methylcellulose, Laxative, (CITRUCEL PO) Take by mouth as needed.     . Multiple Vitamin (MULTIVITAMIN) capsule Take 1 capsule by mouth daily.    . SUMAtriptan (IMITREX) 25 MG tablet Take 25 mg by mouth every 2 (two) hours as needed for migraine. May repeat in 2 hours if  headache persists or recurs.     No current facility-administered medications for this visit.     Family History  Problem Relation Age of Onset  . Hypertension Father   . Breast cancer Maternal Aunt        also with fallopian tube CA--DEC  . Diabetes Maternal Grandmother   . Hypertension Maternal Grandmother   . Thyroid disease Maternal Grandmother   . Hypertension Maternal Grandfather   . Stroke Maternal Grandfather   . Hypertension Paternal Grandmother   . Hypertension Paternal Grandfather     Review of Systems  All other systems reviewed and are negative.   Exam:   BP 140/82 (BP Location: Right Arm, Patient Position: Sitting, Cuff Size: Normal)   Pulse 70   Resp 16   Ht 5' 5.5" (1.664 m)   Wt 167 lb 6.4 oz (75.9 kg)   LMP 02/03/2018 (Exact Date)   BMI 27.43 kg/m      General appearance: alert, cooperative and appears stated age Head: Normocephalic, without obvious abnormality, atraumatic Neck: no adenopathy, supple, symmetrical, trachea midline and thyroid normal to inspection and palpation Lungs: clear to auscultation bilaterally Breasts: normal appearance, no masses or tenderness, No nipple retraction or dimpling, No nipple discharge or bleeding, No axillary or supraclavicular adenopathy Heart: regular rate and rhythm Abdomen: soft, non-tender; no masses, no organomegaly Extremities: extremities normal, atraumatic, no cyanosis or edema Skin: Skin color, texture, turgor normal. No rashes or lesions Lymph nodes: Cervical, supraclavicular, and axillary nodes normal. No abnormal inguinal nodes palpated Neurologic: Grossly normal  Pelvic: External genitalia:  no lesions              Urethra:  normal appearing urethra with no masses, tenderness or lesions              Bartholins and Skenes: normal                 Vagina: normal appearing vagina with normal color and discharge, no lesions.  First degree cystocele and rectocele.               Cervix: no lesions.  IUD strings noted.               Pap taken: No. Bimanual Exam:  Uterus:  normal size, contour, position, consistency, mobility, non-tender              Adnexa: no mass, fullness, tenderness              Rectal exam: Yes.  .  Confirms.              Anus:  normal sphincter tone, no lesions  Chaperone was present for exam.  Assessment:   Well woman visit with normal exam. Mirena IUD patient.  Menstrual migraines.  Mild stress incontinence. Cystocele and rectocele. Weight gain.   Plan: Mammogram screening. Recommended self breast awareness. Pap and HR HPV as above. Guidelines for Calcium, Vitamin D, regular exercise program including cardiovascular and weight bearing exercise. She will restart [employer]. Colonoscopy due this year.  She may arrange through her PCP.  Routine labs  with PCP.  We discussed her prolapse.  She will do her Kegel's.  I did mention pelvic floor PT, Pessary and surgical care.  IUD exchange this year.  Patient will call back to schedule this.  Follow up annually and prn.   After visit summary provided.

## 2018-03-04 ENCOUNTER — Encounter: Payer: Self-pay | Admitting: Obstetrics and Gynecology

## 2018-03-04 ENCOUNTER — Ambulatory Visit: Payer: BLUE CROSS/BLUE SHIELD | Admitting: Obstetrics and Gynecology

## 2018-03-04 ENCOUNTER — Other Ambulatory Visit: Payer: Self-pay

## 2018-03-04 VITALS — BP 140/82 | HR 70 | Resp 16 | Ht 65.5 in | Wt 167.4 lb

## 2018-03-04 DIAGNOSIS — Z01419 Encounter for gynecological examination (general) (routine) without abnormal findings: Secondary | ICD-10-CM | POA: Diagnosis not present

## 2018-03-04 NOTE — Patient Instructions (Signed)

## 2018-09-24 ENCOUNTER — Telehealth: Payer: Self-pay | Admitting: Obstetrics and Gynecology

## 2018-09-24 DIAGNOSIS — Z3009 Encounter for other general counseling and advice on contraception: Secondary | ICD-10-CM

## 2018-09-24 NOTE — Telephone Encounter (Signed)
Patient calling to schedule mirena removal.

## 2018-09-24 NOTE — Telephone Encounter (Signed)
Spoke with patient, requesting to schedule Mirena IUD exchange 11/2018. Last AEX 03/04/18. IUD placed 12/22/13.   IUD exchange scheduled for 12/07/18 at 11:30am with Dr. Quincy Simmonds.   Orders placed for precert.   Routing to provider for final review. Patient is agreeable to disposition. Will close encounter.  Cc: Magdalene Patricia, [address] [employer]

## 2018-11-18 DIAGNOSIS — Z803 Family history of malignant neoplasm of breast: Secondary | ICD-10-CM | POA: Diagnosis not present

## 2018-11-18 DIAGNOSIS — Z1231 Encounter for screening mammogram for malignant neoplasm of breast: Secondary | ICD-10-CM | POA: Diagnosis not present

## 2018-11-25 DIAGNOSIS — Z13220 Encounter for screening for lipoid disorders: Secondary | ICD-10-CM | POA: Diagnosis not present

## 2018-11-25 DIAGNOSIS — Z1389 Encounter for screening for other disorder: Secondary | ICD-10-CM | POA: Diagnosis not present

## 2018-11-25 DIAGNOSIS — Z0000 Encounter for general adult medical examination without abnormal findings: Secondary | ICD-10-CM | POA: Diagnosis not present

## 2018-12-01 ENCOUNTER — Telehealth: Payer: Self-pay | Admitting: Obstetrics and Gynecology

## 2018-12-01 NOTE — Telephone Encounter (Signed)
Call placed to get new insurance information. Unable to leave a message voicemail not setup.

## 2018-12-02 NOTE — Telephone Encounter (Signed)
Spoke with patient and conveyed benefits. Patient understands/agreeable with the benefits. Patient is aware of the cancellation policy. Appointment scheduled 12/07/18

## 2018-12-02 NOTE — Telephone Encounter (Signed)
Patient returned call to go over benefits. °

## 2018-12-03 NOTE — Progress Notes (Signed)
GYNECOLOGY  VISIT   HPI: 48 y.o.   Married  Caucasian  female   [phone] with Patient's last menstrual period was 11/20/2018 (approximate).   here for Mirena IUD exchange.    She had a different period in September where she spotted a lot off and on during a month.  Normal menses in October.  She can spot half way between menses.   UPT: Neg  GYNECOLOGIC HISTORY: Patient's last menstrual period was 11/20/2018 (approximate). Contraception:  Mirena IUD 12-22-13 Menopausal hormone therapy:  n/a Last mammogram: 11-12-17 3D/Neg/density C/BiRads1 Last pap smear:  02-19-17 Neg:Neg HR HPV, 11-23-14 Neg:Neg HR HPV,11-02-12 Neg:Neg HR HPV         OB History    Gravida  3   Para  3   Term  3   Preterm      AB      Living  3     SAB      TAB      Ectopic      Multiple      Live Births                 Patient Active Problem List   Diagnosis Date Noted  . Menorrhagia with irregular cycle 07/29/2013    Past Medical History:  Diagnosis Date  . IBS (irritable bowel syndrome)   . Inner ear disease   . Migraine     Past Surgical History:  Procedure Laterality Date  . INTRAUTERINE DEVICE (IUD) INSERTION  12/22/13   Mirena  . NASAL SEPTUM SURGERY  1988  . NASAL SINUS SURGERY  1988, 1990  . WISDOM TOOTH EXTRACTION      Current Outpatient Medications  Medication Sig Dispense Refill  . acetaminophen (TYLENOL) 325 MG tablet Take 650 mg by mouth every 6 (six) hours as needed.    . ALPRAZolam (XANAX) 0.25 MG tablet Take 1 tablet by mouth as needed.    . Ascorbic Acid (VITAMIN C) 100 MG tablet Take 500 mg by mouth daily.     . calcium carbonate (OS-CAL) 600 MG TABS tablet Take 600 mg by mouth 2 (two) times daily with a meal.    . Cinnamon 500 MG capsule Take 500 mg by mouth daily.    . Coenzyme Q10 (CO Q-10 PO) Take 1 tablet by mouth daily.    . Cyanocobalamin (B-12) 500 MCG TABS Take 1 tablet by mouth daily.    Marland Kitchen dicyclomine (BENTYL) 20 MG tablet Take 1 tablet by mouth  as needed.  5  . Fexofenadine HCl (ALLEGRA ALLERGY PO) Take 1 tablet by mouth as needed.     Marland Kitchen ibuprofen (ADVIL,MOTRIN) 200 MG tablet Take 200 mg by mouth every 6 (six) hours as needed.    . lactobacillus acidophilus (BACID) TABS tablet Take 2 tablets by mouth 3 (three) times daily.    . meclizine (ANTIVERT) 25 MG tablet Take 25 mg by mouth 3 (three) times daily as needed for dizziness.    . Methylcellulose, Laxative, (CITRUCEL PO) Take by mouth as needed.     . Multiple Vitamin (MULTIVITAMIN) capsule Take 1 capsule by mouth daily.    . SUMAtriptan (IMITREX) 25 MG tablet Take 25 mg by mouth every 2 (two) hours as needed for migraine. May repeat in 2 hours if headache persists or recurs.     No current facility-administered medications for this visit.      ALLERGIES: Patient has no known allergies.  Family History  Problem Relation Age of Onset  .  Hypertension Father   . Breast cancer Maternal Aunt        also with fallopian tube CA--DEC  . Diabetes Maternal Grandmother   . Hypertension Maternal Grandmother   . Thyroid disease Maternal Grandmother   . Hypertension Maternal Grandfather   . Stroke Maternal Grandfather   . Hypertension Paternal Grandmother   . Hypertension Paternal Grandfather     Social History   Socioeconomic History  . Marital status: Married    Spouse name: Shannel Zahm  . Number of children: 3  . Years of education: College  . Highest education level: Not on file  Occupational History    Employer: [employer]    Comment: [employer]  Social Needs  . Financial resource strain: Not on file  . Food insecurity    Worry: Not on file    Inability: Not on file  . Transportation needs    Medical: Not on file    Non-medical: Not on file  Tobacco Use  . Smoking status: Never Smoker  . Smokeless tobacco: Never Used  Substance and Sexual Activity  . Alcohol use: No    Alcohol/week: 0.0 standard drinks  . Drug use: No  . Sexual activity: Yes     Partners: Male    Birth control/protection: I.U.D.    Comment: Mirena Insertion 12/22/13  Lifestyle  . Physical activity    Days per week: Not on file    Minutes per session: Not on file  . Stress: Not on file  Relationships  . Social Musician on phone: Not on file    Gets together: Not on file    Attends religious service: Not on file    Active member of club or organization: Not on file    Attends meetings of clubs or organizations: Not on file    Relationship status: Not on file  . Intimate partner violence    Fear of current or ex partner: Not on file    Emotionally abused: Not on file    Physically abused: Not on file    Forced sexual activity: Not on file  Other Topics Concern  . Not on file  Social History Narrative   Patient lives at home with her family.   Caffeine Use: 1-2 cups daily    Review of Systems  All other systems reviewed and are negative.   PHYSICAL EXAMINATION:    BP 138/82   Pulse 76   Temp 98 F (36.7 C) (Temporal)   Ht 5' 5.5" (1.664 m)   Wt 168 lb (76.2 kg)   LMP 11/20/2018 (Approximate)   BMI 27.53 kg/m     General appearance: alert, cooperative and appears stated age   Pelvic: External genitalia:  no lesions              Urethra:  normal appearing urethra with no masses, tenderness or lesions              Bartholins and Skenes: normal                 Vagina: normal appearing vagina with normal color and discharge, no lesions              Cervix: no lesions                Bimanual Exam:  Uterus:  normal size, contour, position, consistency, mobility, non-tender              Adnexa: no mass, fullness,  tenderness   Consent for Mirena IUD removal and insertion of new Mirena.  Lot [id number]   , expiration  Dec. 2022. Speculum placed in vagina.  Sterile prep of cervix with Hibiclens Paracervical block with 10 cc 1% lidocaine - lot  [id number], expiration April 2022.. Tenaculum to anterior cervical lip.  Ring forceps used to  remove IUD intact, shown to patient, and discarded. Uterus sounded to almost 8.5  cm.  Mirena IUD placed without difficulty.  Strings trimmed and shown to patient.  Repeat bimanual exam, no change. No complications.  Minimal EBL.  ASSESSMENT  Mirena IUD removal and re-insertion of new Mirena.  PLAN  Instructions and precautions given.  Back up contraception for one week.  Card given to patient with insertion date, recommended removal date, and lot number.  Follow up for a recheck in 4 weeks, sooner as needed. We discussed measuring FSH and estradiol if we need to determine menopausal status.   After visit summary to patient.

## 2018-12-04 ENCOUNTER — Other Ambulatory Visit: Payer: Self-pay

## 2018-12-07 ENCOUNTER — Encounter: Payer: Self-pay | Admitting: Obstetrics and Gynecology

## 2018-12-07 ENCOUNTER — Ambulatory Visit (INDEPENDENT_AMBULATORY_CARE_PROVIDER_SITE_OTHER): Payer: BC Managed Care – PPO | Admitting: Obstetrics and Gynecology

## 2018-12-07 ENCOUNTER — Other Ambulatory Visit: Payer: Self-pay

## 2018-12-07 VITALS — BP 138/82 | HR 76 | Temp 98.0°F | Ht 65.5 in | Wt 168.0 lb

## 2018-12-07 DIAGNOSIS — Z30433 Encounter for removal and reinsertion of intrauterine contraceptive device: Secondary | ICD-10-CM

## 2018-12-07 DIAGNOSIS — Z3009 Encounter for other general counseling and advice on contraception: Secondary | ICD-10-CM

## 2018-12-07 DIAGNOSIS — Z01812 Encounter for preprocedural laboratory examination: Secondary | ICD-10-CM

## 2018-12-07 HISTORY — PX: INTRAUTERINE DEVICE (IUD) INSERTION: SHX5877

## 2018-12-07 LAB — POCT URINE PREGNANCY: Preg Test, Ur: NEGATIVE

## 2018-12-07 NOTE — Patient Instructions (Signed)
Intrauterine Device Insertion, Care After  This sheet gives you information about how to care for yourself after your procedure. Your health care provider may also give you more specific instructions. If you have problems or questions, contact your health care provider. What can I expect after the procedure? After the procedure, it is common to have:  Cramps and pain in the abdomen.  Light bleeding (spotting) or heavier bleeding that is like your menstrual period. This may last for up to a few days.  Lower back pain.  Dizziness.  Headaches.  Nausea. Follow these instructions at home:  Before resuming sexual activity, check to make sure that you can feel the IUD string(s). You should be able to feel the end of the string(s) below the opening of your cervix. If your IUD string is in place, you may resume sexual activity. ? If you had a hormonal IUD inserted more than 7 days after your most recent period started, you will need to use a backup method of birth control for 7 days after IUD insertion. Ask your health care provider whether this applies to you.  Continue to check that the IUD is still in place by feeling for the string(s) after every menstrual period, or once a month.  Take over-the-counter and prescription medicines only as told by your health care provider.  Do not drive or use heavy machinery while taking prescription pain medicine.  Keep all follow-up visits as told by your health care provider. This is important. Contact a health care provider if:  You have bleeding that is heavier or lasts longer than a normal menstrual cycle.  You have a fever.  You have cramps or abdominal pain that get worse or do not get better with medicine.  You develop abdominal pain that is new or is not in the same area of earlier cramping and pain.  You feel lightheaded or weak.  You have abnormal or bad-smelling discharge from your vagina.  You have pain during sexual activity.   You have any of the following problems with your IUD string(s): ? The string bothers or hurts you or your sexual partner. ? You cannot feel the string. ? The string has gotten longer.  You can feel the IUD in your vagina.  You think you may be pregnant, or you miss your menstrual period.  You think you may have an STI (sexually transmitted infection). Get help right away if:  You have flu-like symptoms.  You have a fever and chills.  You can feel that your IUD has slipped out of place. Summary  After the procedure, it is common to have cramps and pain in the abdomen. It is also common to have light bleeding (spotting) or heavier bleeding that is like your menstrual period.  Continue to check that the IUD is still in place by feeling for the string(s) after every menstrual period, or once a month.  Keep all follow-up visits as told by your health care provider. This is important.  Contact your health care provider if you have problems with your IUD string(s), such as the string getting longer or bothering you or your sexual partner. This information is not intended to replace advice given to you by your health care provider. Make sure you discuss any questions you have with your health care provider. Document Released: 09/05/2010 Document Revised: 12/20/2016 Document Reviewed: 11/29/2015 Elsevier Patient Education  2020 Elsevier Inc.  

## 2019-01-05 ENCOUNTER — Other Ambulatory Visit: Payer: Self-pay

## 2019-01-05 NOTE — Progress Notes (Signed)
GYNECOLOGY  VISIT   HPI: 48 y.o.   Married  Caucasian  female   [phone] with Patient's last menstrual period was 01/03/2019.   here for 4 week follow up after Mirena IUD insertion.    Bled and had discomfort for about 2 days after IUD placed.  She then had a fairly normal cycle shortly thereafter.  Now having spotting.  Does feel some increased heat.   No painful intercourse.   Working for [employer] pulmonary on [employer].   GYNECOLOGIC HISTORY: Patient's last menstrual period was 01/03/2019. Contraception:  Mirena IUD 12-07-18 Menopausal hormone therapy:  none Last mammogram: 11-18-18 3D/Neg/density C/BiRads1 Last pap smear: 02-19-17 Neg:Neg HR HPV, 11-23-14 Neg:Neg HR HPV,11-02-12 Neg:Neg HR HPV         OB History    Gravida  3   Para  3   Term  3   Preterm      AB      Living  3     SAB      TAB      Ectopic      Multiple      Live Births                 Patient Active Problem List   Diagnosis Date Noted  . Menorrhagia with irregular cycle 07/29/2013    Past Medical History:  Diagnosis Date  . IBS (irritable bowel syndrome)   . Inner ear disease   . Migraine     Past Surgical History:  Procedure Laterality Date  . INTRAUTERINE DEVICE (IUD) INSERTION  12/22/13   Mirena  . NASAL SEPTUM SURGERY  1988  . NASAL SINUS SURGERY  1988, 1990  . WISDOM TOOTH EXTRACTION      Current Outpatient Medications  Medication Sig Dispense Refill  . acetaminophen (TYLENOL) 325 MG tablet Take 650 mg by mouth every 6 (six) hours as needed.    . ALPRAZolam (XANAX) 0.25 MG tablet Take 1 tablet by mouth as needed.    . Ascorbic Acid (VITAMIN C) 100 MG tablet Take 500 mg by mouth daily.     . calcium carbonate (OS-CAL) 600 MG TABS tablet Take 600 mg by mouth 2 (two) times daily with a meal.    . Cinnamon 500 MG capsule Take 500 mg by mouth daily.    . Coenzyme Q10 (CO Q-10 PO) Take 1 tablet by mouth daily.    . Cyanocobalamin (B-12) 500 MCG TABS Take 1 tablet  by mouth daily.    Marland Kitchen dicyclomine (BENTYL) 20 MG tablet Take 1 tablet by mouth as needed.  5  . Fexofenadine HCl (ALLEGRA ALLERGY PO) Take 1 tablet by mouth as needed.     Marland Kitchen ibuprofen (ADVIL,MOTRIN) 200 MG tablet Take 200 mg by mouth every 6 (six) hours as needed.    . lactobacillus acidophilus (BACID) TABS tablet Take 2 tablets by mouth 3 (three) times daily.    Marland Kitchen levonorgestrel (MIRENA) 20 MCG/24HR IUD 1 each by Intrauterine route once.    . meclizine (ANTIVERT) 25 MG tablet Take 25 mg by mouth 3 (three) times daily as needed for dizziness.    . Methylcellulose, Laxative, (CITRUCEL PO) Take by mouth as needed.     . Multiple Vitamin (MULTIVITAMIN) capsule Take 1 capsule by mouth daily.    . SUMAtriptan (IMITREX) 25 MG tablet Take 25 mg by mouth every 2 (two) hours as needed for migraine. May repeat in 2 hours if headache persists or recurs.  No current facility-administered medications for this visit.     ALLERGIES: Patient has no known allergies.  Family History  Problem Relation Age of Onset  . Hypertension Father   . Breast cancer Maternal Aunt        also with fallopian tube CA--DEC  . Diabetes Maternal Grandmother   . Hypertension Maternal Grandmother   . Thyroid disease Maternal Grandmother   . Hypertension Maternal Grandfather   . Stroke Maternal Grandfather   . Hypertension Paternal Grandmother   . Hypertension Paternal Grandfather     Social History   Socioeconomic History  . Marital status: Married    Spouse name: Ephriam KnucklesMike Lacina  . Number of children: 3  . Years of education: College  . Highest education level: Not on file  Occupational History    Employer: [employer]    Comment: [employer]  Tobacco Use  . Smoking status: Never Smoker  . Smokeless tobacco: Never Used  Substance and Sexual Activity  . Alcohol use: No    Alcohol/week: 0.0 standard drinks  . Drug use: No  . Sexual activity: Yes    Partners: Male    Birth control/protection:  I.U.D.    Comment: Mirena Insertion 12/22/13  Other Topics Concern  . Not on file  Social History Narrative   Patient lives at home with her family.   Caffeine Use: 1-2 cups daily   Social Determinants of Health   Financial Resource Strain:   . Difficulty of Paying Living Expenses: Not on file  Food Insecurity:   . Worried About Programme researcher, broadcasting/film/videounning Out of Food in the Last Year: Not on file  . Ran Out of Food in the Last Year: Not on file  Transportation Needs:   . Lack of Transportation (Medical): Not on file  . Lack of Transportation (Non-Medical): Not on file  Physical Activity:   . Days of Exercise per Week: Not on file  . Minutes of Exercise per Session: Not on file  Stress:   . Feeling of Stress : Not on file  Social Connections:   . Frequency of Communication with Friends and Family: Not on file  . Frequency of Social Gatherings with Friends and Family: Not on file  . Attends Religious Services: Not on file  . Active Member of Clubs or Organizations: Not on file  . Attends BankerClub or Organization Meetings: Not on file  . Marital Status: Not on file  Intimate Partner Violence:   . Fear of Current or Ex-Partner: Not on file  . Emotionally Abused: Not on file  . Physically Abused: Not on file  . Sexually Abused: Not on file    Review of Systems  Constitutional: Negative.   HENT: Negative.   Eyes: Negative.   Respiratory: Negative.   Cardiovascular: Negative.   Gastrointestinal: Negative.   Endocrine: Negative.   Genitourinary: Negative.   Musculoskeletal: Negative.   Allergic/Immunologic: Negative.   Neurological: Negative.   Hematological: Negative.   Psychiatric/Behavioral: Negative.     PHYSICAL EXAMINATION:    BP 126/82 (BP Location: Right Arm, Patient Position: Sitting, Cuff Size: Normal)   Pulse 76   Temp (!) 97.5 F (36.4 C) (Skin)   Resp 14   Ht 5\' 6"  (1.676 m)   Wt 171 lb 6.4 oz (77.7 kg)   LMP 01/03/2019   BMI 27.66 kg/m     General appearance: alert,  cooperative and appears stated age  Pelvic: External genitalia:  no lesions  Urethra:  normal appearing urethra with no masses, tenderness or lesions              Bartholins and Skenes: normal                 Vagina: normal appearing vagina with normal color and discharge, no lesions              Cervix: no lesions.  Small amount of brown spotting.  IUD strings present.                Bimanual Exam:  Uterus:  normal size, contour, position, consistency, mobility, non-tender              Adnexa: no mass, fullness, tenderness             Chaperone was present for exam.  ASSESSMENT  Mirena IUD check up.   PLAN  We discussed Mirena IUD and benefits for pregnancy prevention, treatment of heavy and painful menses, and HRT.  We reviewed FSH and E2 as a way to determine menopausal status.   An After Visit Summary was printed and given to the patient.  __15____ minutes face to face time of which over 50% was spent in counseling.

## 2019-01-06 ENCOUNTER — Encounter: Payer: Self-pay | Admitting: Obstetrics and Gynecology

## 2019-01-06 ENCOUNTER — Ambulatory Visit: Payer: BC Managed Care – PPO | Admitting: Obstetrics and Gynecology

## 2019-01-06 VITALS — BP 126/82 | HR 76 | Temp 97.5°F | Resp 14 | Ht 66.0 in | Wt 171.4 lb

## 2019-01-06 DIAGNOSIS — Z30431 Encounter for routine checking of intrauterine contraceptive device: Secondary | ICD-10-CM

## 2019-02-19 DIAGNOSIS — Z1159 Encounter for screening for other viral diseases: Secondary | ICD-10-CM | POA: Diagnosis not present

## 2019-02-24 DIAGNOSIS — Z1211 Encounter for screening for malignant neoplasm of colon: Secondary | ICD-10-CM | POA: Diagnosis not present

## 2019-03-16 ENCOUNTER — Other Ambulatory Visit: Payer: Self-pay

## 2019-03-17 ENCOUNTER — Encounter: Payer: Self-pay | Admitting: Obstetrics and Gynecology

## 2019-03-17 ENCOUNTER — Ambulatory Visit (INDEPENDENT_AMBULATORY_CARE_PROVIDER_SITE_OTHER): Payer: BC Managed Care – PPO | Admitting: Obstetrics and Gynecology

## 2019-03-17 VITALS — BP 130/80 | HR 60 | Temp 97.2°F | Resp 14 | Ht 65.0 in | Wt 175.0 lb

## 2019-03-17 DIAGNOSIS — Z01419 Encounter for gynecological examination (general) (routine) without abnormal findings: Secondary | ICD-10-CM

## 2019-03-17 NOTE — Patient Instructions (Signed)
EXERCISE AND DIET:  We recommended that you start or continue a regular exercise program for good health. Regular exercise means any activity that makes your heart beat faster and makes you sweat.  We recommend exercising at least 30 minutes per day at least 3 days a week, preferably 4 or 5.  We also recommend a diet low in fat and sugar.  Inactivity, poor dietary choices and obesity can cause diabetes, heart attack, stroke, and kidney damage, among others.    ALCOHOL AND SMOKING:  Women should limit their alcohol intake to no more than 7 drinks/beers/glasses of wine (combined, not each!) per week. Moderation of alcohol intake to this level decreases your risk of breast cancer and liver damage. And of course, no recreational drugs are part of a healthy lifestyle.  And absolutely no smoking or even second hand smoke. Most people know smoking can cause heart and lung diseases, but did you know it also contributes to weakening of your bones? Aging of your skin?  Yellowing of your teeth and nails?  CALCIUM AND VITAMIN D:  Adequate intake of calcium and Vitamin D are recommended.  The recommendations for exact amounts of these supplements seem to change often, but generally speaking 600 mg of calcium (either carbonate or citrate) and 800 units of Vitamin D per day seems prudent. Certain women may benefit from higher intake of Vitamin D.  If you are among these women, your doctor will have told you during your visit.    PAP SMEARS:  Pap smears, to check for cervical cancer or precancers,  have traditionally been done yearly, although recent scientific advances have shown that most women can have pap smears less often.  However, every woman still should have a physical exam from her gynecologist every year. It will include a breast check, inspection of the vulva and vagina to check for abnormal growths or skin changes, a visual exam of the cervix, and then an exam to evaluate the size and shape of the uterus and  ovaries.  And after 51 years of age, a rectal exam is indicated to check for rectal cancers. We will also provide age appropriate advice regarding health maintenance, like when you should have certain vaccines, screening for sexually transmitted diseases, bone density testing, colonoscopy, mammograms, etc.   MAMMOGRAMS:  All women over 40 years old should have a yearly mammogram. Many facilities now offer a "3D" mammogram, which may cost around $50 extra out of pocket. If possible,  we recommend you accept the option to have the 3D mammogram performed.  It both reduces the number of women who will be called back for extra views which then turn out to be normal, and it is better than the routine mammogram at detecting truly abnormal areas.    COLONOSCOPY:  Colonoscopy to screen for colon cancer is recommended for all women at age 50.  We know, you hate the idea of the prep.  We agree, BUT, having colon cancer and not knowing it is worse!!  Colon cancer so often starts as a polyp that can be seen and removed at colonscopy, which can quite literally save your life!  And if your first colonoscopy is normal and you have no family history of colon cancer, most women don't have to have it again for 10 years.  Once every ten years, you can do something that may end up saving your life, right?  We will be happy to help you get it scheduled when you are ready.    Be sure to check your insurance coverage so you understand how much it will cost.  It may be covered as a preventative service at no cost, but you should check your particular policy.      Calorie Counting for Weight Loss Calories are units of energy. Your body needs a certain amount of calories from food to keep you going throughout the day. When you eat more calories than your body needs, your body stores the extra calories as fat. When you eat fewer calories than your body needs, your body burns fat to get the energy it needs. Calorie counting means  keeping track of how many calories you eat and drink each day. Calorie counting can be helpful if you need to lose weight. If you make sure to eat fewer calories than your body needs, you should lose weight. Ask your health care provider what a healthy weight is for you. For calorie counting to work, you will need to eat the right number of calories in a day in order to lose a healthy amount of weight per week. A dietitian can help you determine how many calories you need in a day and will give you suggestions on how to reach your calorie goal.  A healthy amount of weight to lose per week is usually 1-2 lb (0.5-0.9 kg). This usually means that your daily calorie intake should be reduced by 500-750 calories.  Eating 1,200 - 1,500 calories per day can help most women lose weight.  Eating 1,500 - 1,800 calories per day can help most men lose weight. What is my plan? My goal is to have __________ calories per day. If I have this many calories per day, I should lose around __________ pounds per week. What do I need to know about calorie counting? In order to meet your daily calorie goal, you will need to:  Find out how many calories are in each food you would like to eat. Try to do this before you eat.  Decide how much of the food you plan to eat.  Write down what you ate and how many calories it had. Doing this is called keeping a food log. To successfully lose weight, it is important to balance calorie counting with a healthy lifestyle that includes regular activity. Aim for 150 minutes of moderate exercise (such as walking) or 75 minutes of vigorous exercise (such as running) each week. Where do I find calorie information?  The number of calories in a food can be found on a Nutrition Facts label. If a food does not have a Nutrition Facts label, try to look up the calories online or ask your dietitian for help. Remember that calories are listed per serving. If you choose to have more than one  serving of a food, you will have to multiply the calories per serving by the amount of servings you plan to eat. For example, the label on a package of bread might say that a serving size is 1 slice and that there are 90 calories in a serving. If you eat 1 slice, you will have eaten 90 calories. If you eat 2 slices, you will have eaten 180 calories. How do I keep a food log? Immediately after each meal, record the following information in your food log:  What you ate. Don't forget to include toppings, sauces, and other extras on the food.  How much you ate. This can be measured in cups, ounces, or number of items.  How many calories  each food and drink had.  The total number of calories in the meal. Keep your food log near you, such as in a small notebook in your pocket, or use a mobile app or website. Some programs will calculate calories for you and show you how many calories you have left for the day to meet your goal. What are some calorie counting tips?   Use your calories on foods and drinks that will fill you up and not leave you hungry: ? Some examples of foods that fill you up are nuts and nut butters, vegetables, lean proteins, and high-fiber foods like whole grains. High-fiber foods are foods with more than 5 g fiber per serving. ? Drinks such as sodas, specialty coffee drinks, alcohol, and juices have a lot of calories, yet do not fill you up.  Eat nutritious foods and avoid empty calories. Empty calories are calories you get from foods or beverages that do not have many vitamins or protein, such as candy, sweets, and soda. It is better to have a nutritious high-calorie food (such as an avocado) than a food with few nutrients (such as a bag of chips).  Know how many calories are in the foods you eat most often. This will help you calculate calorie counts faster.  Pay attention to calories in drinks. Low-calorie drinks include water and unsweetened drinks.  Pay attention to  nutrition labels for "low fat" or "fat free" foods. These foods sometimes have the same amount of calories or more calories than the full fat versions. They also often have added sugar, starch, or salt, to make up for flavor that was removed with the fat.  Find a way of tracking calories that works for you. Get creative. Try different apps or programs if writing down calories does not work for you. What are some portion control tips?  Know how many calories are in a serving. This will help you know how many servings of a certain food you can have.  Use a measuring cup to measure serving sizes. You could also try weighing out portions on a kitchen scale. With time, you will be able to estimate serving sizes for some foods.  Take some time to put servings of different foods on your favorite plates, bowls, and cups so you know what a serving looks like.  Try not to eat straight from a bag or box. Doing this can lead to overeating. Put the amount you would like to eat in a cup or on a plate to make sure you are eating the right portion.  Use smaller plates, glasses, and bowls to prevent overeating.  Try not to multitask (for example, watch TV or use your computer) while eating. If it is time to eat, sit down at a table and enjoy your food. This will help you to know when you are full. It will also help you to be aware of what you are eating and how much you are eating. What are tips for following this plan? Reading food labels  Check the calorie count compared to the serving size. The serving size may be smaller than what you are used to eating.  Check the source of the calories. Make sure the food you are eating is high in vitamins and protein and low in saturated and trans fats. Shopping  Read nutrition labels while you shop. This will help you make healthy decisions before you decide to purchase your food.  Make a grocery list and stick to it.  Cooking  Try to cook your favorite foods in a  healthier way. For example, try baking instead of frying.  Use low-fat dairy products. Meal planning  Use more fruits and vegetables. Half of your plate should be fruits and vegetables.  Include lean proteins like poultry and fish. How do I count calories when eating out?  Ask for smaller portion sizes.  Consider sharing an entree and sides instead of getting your own entree.  If you get your own entree, eat only half. Ask for a box at the beginning of your meal and put the rest of your entree in it so you are not tempted to eat it.  If calories are listed on the menu, choose the lower calorie options.  Choose dishes that include vegetables, fruits, whole grains, low-fat dairy products, and lean protein.  Choose items that are boiled, broiled, grilled, or steamed. Stay away from items that are buttered, battered, fried, or served with cream sauce. Items labeled "crispy" are usually fried, unless stated otherwise.  Choose water, low-fat milk, unsweetened iced tea, or other drinks without added sugar. If you want an alcoholic beverage, choose a lower calorie option such as a glass of wine or light beer.  Ask for dressings, sauces, and syrups on the side. These are usually high in calories, so you should limit the amount you eat.  If you want a salad, choose a garden salad and ask for grilled meats. Avoid extra toppings like bacon, cheese, or fried items. Ask for the dressing on the side, or ask for olive oil and vinegar or lemon to use as dressing.  Estimate how many servings of a food you are given. For example, a serving of cooked rice is  cup or about the size of half a baseball. Knowing serving sizes will help you be aware of how much food you are eating at restaurants. The list below tells you how big or small some common portion sizes are based on everyday objects: ? 1 oz--4 stacked dice. ? 3 oz--1 deck of cards. ? 1 tsp--1 die. ? 1 Tbsp-- a ping-pong ball. ? 2 Tbsp--1  ping-pong ball. ?  cup-- baseball. ? 1 cup--1 baseball. Summary  Calorie counting means keeping track of how many calories you eat and drink each day. If you eat fewer calories than your body needs, you should lose weight.  A healthy amount of weight to lose per week is usually 1-2 lb (0.5-0.9 kg). This usually means reducing your daily calorie intake by 500-750 calories.  The number of calories in a food can be found on a Nutrition Facts label. If a food does not have a Nutrition Facts label, try to look up the calories online or ask your dietitian for help.  Use your calories on foods and drinks that will fill you up, and not on foods and drinks that will leave you hungry.  Use smaller plates, glasses, and bowls to prevent overeating. This information is not intended to replace advice given to you by your health care provider. Make sure you discuss any questions you have with your health care provider. Document Revised: 09/26/2017 Document Reviewed: 12/08/2015 Elsevier Patient Education  2020 [employer].  Exercising to Lose Weight Exercise is structured, repetitive physical activity to improve fitness and health. Getting regular exercise is important for everyone. It is especially important if you are overweight. Being overweight increases your risk of heart disease, stroke, diabetes, high blood pressure, and several types of cancer. Reducing your calorie  intake and exercising can help you lose weight. Exercise is usually categorized as moderate or vigorous intensity. To lose weight, most people need to do a certain amount of moderate-intensity or vigorous-intensity exercise each week. Moderate-intensity exercise  Moderate-intensity exercise is any activity that gets you moving enough to burn at least three times more energy (calories) than if you were sitting. Examples of moderate exercise include:  Walking a mile in 15 minutes.  Doing light yard work.  Biking at an easy  pace. Most people should get at least 150 minutes (2 hours and 30 minutes) a week of moderate-intensity exercise to maintain their body weight. Vigorous-intensity exercise Vigorous-intensity exercise is any activity that gets you moving enough to burn at least six times more calories than if you were sitting. When you exercise at this intensity, you should be working hard enough that you are not able to carry on a conversation. Examples of vigorous exercise include:  Running.  Playing a team sport, such as football, basketball, and soccer.  Jumping rope. Most people should get at least 75 minutes (1 hour and 15 minutes) a week of vigorous-intensity exercise to maintain their body weight. How can exercise affect me? When you exercise enough to burn more calories than you eat, you lose weight. Exercise also reduces body fat and builds muscle. The more muscle you have, the more calories you burn. Exercise also:  Improves mood.  Reduces stress and tension.  Improves your overall fitness, flexibility, and endurance.  Increases bone strength. The amount of exercise you need to lose weight depends on:  Your age.  The type of exercise.  Any health conditions you have.  Your overall physical ability. Talk to your health care provider about how much exercise you need and what types of activities are safe for you. What actions can I take to lose weight? Nutrition   Make changes to your diet as told by your health care provider or diet and nutrition specialist (dietitian). This may include: ? Eating fewer calories. ? Eating more protein. ? Eating less unhealthy fats. ? Eating a diet that includes fresh fruits and vegetables, whole grains, low-fat dairy products, and lean protein. ? Avoiding foods with added fat, salt, and sugar.  Drink plenty of water while you exercise to prevent dehydration or heat stroke. Activity  Choose an activity that you enjoy and set realistic goals. Your  health care provider can help you make an exercise plan that works for you.  Exercise at a moderate or vigorous intensity most days of the week. ? The intensity of exercise may vary from person to person. You can tell how intense a workout is for you by paying attention to your breathing and heartbeat. Most people will notice their breathing and heartbeat get faster with more intense exercise.  Do resistance training twice each week, such as: ? Push-ups. ? Sit-ups. ? Lifting weights. ? Using resistance bands.  Getting short amounts of exercise can be just as helpful as long structured periods of exercise. If you have trouble finding time to exercise, try to include exercise in your daily routine. ? Get up, stretch, and walk around every 30 minutes throughout the day. ? Go for a walk during your lunch break. ? Park your car farther away from your destination. ? If you take public transportation, get off one stop early and walk the rest of the way. ? Make phone calls while standing up and walking around. ? Take the stairs instead of elevators or  escalators.  Wear comfortable clothes and shoes with good support.  Do not exercise so much that you hurt yourself, feel dizzy, or get very short of breath. Where to find more information  [place] Department of Health and Human Services: [employer]  Centers for Disease Control and Prevention (CDC): http://www.wolf.info/ Contact a health care provider:  Before starting a new exercise program.  If you have questions or concerns about your weight.  If you have a medical problem that keeps you from exercising. Get help right away if you have any of the following while exercising:  Injury.  Dizziness.  Difficulty breathing or shortness of breath that does not go away when you stop exercising.  Chest pain.  Rapid heartbeat. Summary  Being overweight increases your risk of heart disease, stroke, diabetes, high blood pressure, and several types of  cancer.  Losing weight happens when you burn more calories than you eat.  Reducing the amount of calories you eat in addition to getting regular moderate or vigorous exercise each week helps you lose weight. This information is not intended to replace advice given to you by your health care provider. Make sure you discuss any questions you have with your health care provider. Document Revised: 01/20/2017 Document Reviewed: 01/20/2017 Elsevier Patient Education  2020 [employer].

## 2019-03-17 NOTE — Progress Notes (Signed)
49 y.o. [id number] Married Caucasian female here for annual exam.    Concerned about her weight.  Not doing regular exercise.  Lost 30 pounds with [employer] in the past.   Still having regular menses.   Can leak urine with a cough.   No leak with sneeze.   PCP:  Georgann Housekeeper, MD   Patient's last menstrual period was 02/26/2019 (exact date).           Sexually active: Yes.    The current method of family planning is IUD--Mirena 12-07-18.    Exercising: No.  The patient does not participate in regular exercise at present. Smoker:  no  Health Maintenance: Pap: 02-19-17 Neg:Neg HR HPV, 11-23-14 Neg:Neg HR HPV, 11-02-12 Neg:Neg HR HPV History of abnormal Pap:  no MMG: 11-18-18 Neg/density C/BiRads1 Colonoscopy: 02-24-19 normal per patient;next 10 years BMD:  Years ago  Result :Normal per patient TDaP:  PCP Gardasil:   no HIV: Neg in Preg Hep C:n/a Screening Labs:  PCP.  Flu vaccine:  Completed.   reports that she has never smoked. She has never used smokeless tobacco. She reports that she does not drink alcohol or use drugs.  Past Medical History:  Diagnosis Date  . IBS (irritable bowel syndrome)   . Inner ear disease   . Migraine     Past Surgical History:  Procedure Laterality Date  . INTRAUTERINE DEVICE (IUD) INSERTION  12/22/13   Mirena  . NASAL SEPTUM SURGERY  1988  . NASAL SINUS SURGERY  1988, 1990  . WISDOM TOOTH EXTRACTION      Current Outpatient Medications  Medication Sig Dispense Refill  . Ascorbic Acid (VITAMIN C) 100 MG tablet Take 500 mg by mouth daily.     . calcium carbonate (OS-CAL) 600 MG TABS tablet Take 600 mg by mouth 2 (two) times daily with a meal.    . Cinnamon 500 MG capsule Take 500 mg by mouth daily.    . Coenzyme Q10 (CO Q-10 PO) Take 1 tablet by mouth daily.    . Cyanocobalamin (B-12) 500 MCG TABS Take 1 tablet by mouth daily.    Marland Kitchen dicyclomine (BENTYL) 20 MG tablet Take 1 tablet by mouth as needed.  5  . Fexofenadine HCl (ALLEGRA  ALLERGY PO) Take 1 tablet by mouth as needed.     Marland Kitchen ibuprofen (ADVIL,MOTRIN) 200 MG tablet Take 200 mg by mouth every 6 (six) hours as needed.    . lactobacillus acidophilus (BACID) TABS tablet Take 2 tablets by mouth 3 (three) times daily.    Marland Kitchen levonorgestrel (MIRENA) 20 MCG/24HR IUD 1 each by Intrauterine route once.    . meclizine (ANTIVERT) 25 MG tablet Take 25 mg by mouth 3 (three) times daily as needed for dizziness.    . Methylcellulose, Laxative, (CITRUCEL PO) Take by mouth as needed.     . Multiple Vitamin (MULTIVITAMIN) capsule Take 1 capsule by mouth daily.    . SUMAtriptan (IMITREX) 25 MG tablet Take 25 mg by mouth every 2 (two) hours as needed for migraine. May repeat in 2 hours if headache persists or recurs.     No current facility-administered medications for this visit.    Family History  Problem Relation Age of Onset  . Hypertension Father   . Breast cancer Maternal Aunt        also with fallopian tube CA--DEC  . Diabetes Maternal Grandmother   . Hypertension Maternal Grandmother   . Thyroid disease Maternal Grandmother   . Hypertension Maternal  Grandfather   . Stroke Maternal Grandfather   . Hypertension Paternal Grandmother   . Hypertension Paternal Grandfather     Review of Systems  All other systems reviewed and are negative.   Exam:   BP 130/80 (Cuff Size: Large)   Pulse 60   Temp (!) 97.2 F (36.2 C) (Temporal)   Resp 14   Ht 5\' 5"  (1.651 m)   Wt 175 lb (79.4 kg)   LMP 02/26/2019 (Exact Date)   BMI 29.12 kg/m     General appearance: alert, cooperative and appears stated age Head: normocephalic, without obvious abnormality, atraumatic Neck: no adenopathy, supple, symmetrical, trachea midline and thyroid normal to inspection and palpation Lungs: clear to auscultation bilaterally Breasts: normal appearance, no masses or tenderness, No nipple retraction or dimpling, No nipple discharge or bleeding, No axillary adenopathy Heart: regular rate and  rhythm Abdomen: soft, non-tender; no masses, no organomegaly Extremities: extremities normal, atraumatic, no cyanosis or edema Skin: skin color, texture, turgor normal. No rashes or lesions Lymph nodes: cervical, supraclavicular, and axillary nodes normal. Neurologic: grossly normal  Pelvic: External genitalia:  no lesions              No abnormal inguinal nodes palpated.              Urethra:  normal appearing urethra with no masses, tenderness or lesions              Bartholins and Skenes: normal                 Vagina: normal appearing vagina with normal color and discharge, no lesions              Cervix: no lesions.  IUD strings seen.              Pap taken: No. Bimanual Exam:  Uterus:  normal size, contour, position, consistency, mobility, non-tender              Adnexa: no mass, fullness, tenderness              Rectal exam: Yes.  .  Confirms.              Anus:  normal sphincter tone, no lesions  Chaperone was present for exam.  Assessment:   Well woman visit with normal exam. Mirena IUD patient.  Menstrual migraines. Mild stress incontinence. Hx cystocele and rectocele.  Support looks good today.  Plan: Mammogram screening discussed. Self breast awareness reviewed. Pap and HR HPV as above. Guidelines for Calcium, Vitamin D, regular exercise program including cardiovascular and weight bearing exercise. Kegel's.  She will contact me if her incontinence increases.  Weight loss discussed.  Labs with PCP. Follow up annually and prn.   After visit summary provided.

## 2019-03-18 DIAGNOSIS — M7742 Metatarsalgia, left foot: Secondary | ICD-10-CM | POA: Diagnosis not present

## 2019-12-01 DIAGNOSIS — Z0000 Encounter for general adult medical examination without abnormal findings: Secondary | ICD-10-CM | POA: Diagnosis not present

## 2019-12-01 DIAGNOSIS — E7800 Pure hypercholesterolemia, unspecified: Secondary | ICD-10-CM | POA: Diagnosis not present

## 2019-12-09 ENCOUNTER — Encounter: Payer: Self-pay | Admitting: Obstetrics and Gynecology

## 2019-12-09 DIAGNOSIS — Z1231 Encounter for screening mammogram for malignant neoplasm of breast: Secondary | ICD-10-CM | POA: Diagnosis not present

## 2020-02-03 DIAGNOSIS — H6123 Impacted cerumen, bilateral: Secondary | ICD-10-CM | POA: Diagnosis not present

## 2020-04-26 ENCOUNTER — Ambulatory Visit: Payer: BC Managed Care – PPO | Admitting: Obstetrics and Gynecology

## 2020-04-26 ENCOUNTER — Other Ambulatory Visit: Payer: Self-pay

## 2020-04-26 ENCOUNTER — Encounter: Payer: Self-pay | Admitting: Obstetrics and Gynecology

## 2020-04-26 VITALS — BP 122/72 | HR 70 | Resp 16 | Ht 65.0 in | Wt 166.0 lb

## 2020-04-26 DIAGNOSIS — Z01419 Encounter for gynecological examination (general) (routine) without abnormal findings: Secondary | ICD-10-CM | POA: Diagnosis not present

## 2020-04-26 DIAGNOSIS — F439 Reaction to severe stress, unspecified: Secondary | ICD-10-CM | POA: Diagnosis not present

## 2020-04-26 MED ORDER — VENLAFAXINE HCL ER 37.5 MG PO CP24: 37.5000 mg | ORAL_CAPSULE | Freq: Every day | ORAL | 1 refills | Status: DC

## 2020-04-26 NOTE — Progress Notes (Signed)
50 y.o. G76P3003 Married Caucasian female here for annual exam.    Not sleeping well.  Melatonin is not working now. Hard to shut her brain off.  Adult children living at home. Dealing with stress.  Some hot flashes at night but not often.  Active lifestyle.   Increase in her migraines.   Irregular menses.  Occur monthly expect a one day cycle in July, no menses in August, one day cycle in February.  Otherwise she has 10 day cycles.  She did have a benign endometrial biopsy on 07/29/13 for prolonged menses.   PCP: Georgann Housekeeper, MD  No LMP recorded. (Menstrual status: IUD).           Sexually active: Yes.    The current method of family planning is IUD--Mirena 12/07/18.    Exercising: No.  The patient does not participate in regular exercise at present. Smoker:  no  Health Maintenance: Pap:  02-19-17 Neg:Neg HR HPV, 11-23-14 Neg:Neg HR HPV, 11-02-12 Neg:Neg HR HPV History of abnormal Pap:  no MMG: 10/2019 normal with [facility] per patient Colonoscopy: 02-24-19 normal per patient;next 10 years BMD:  Years ago  Result :normal per patient TDaP: PCP Gardasil:   no HIV:Neg in Preg Hep C:no Screening Labs:  PCP.    reports that she has never smoked. She has never used smokeless tobacco. She reports that she does not drink alcohol and does not use drugs.  Past Medical History:  Diagnosis Date  . IBS (irritable bowel syndrome)   . Inner ear disease   . Migraine     Past Surgical History:  Procedure Laterality Date  . INTRAUTERINE DEVICE (IUD) INSERTION  12/22/13   Mirena  . NASAL SEPTUM SURGERY  1988  . NASAL SINUS SURGERY  1988, 1990  . WISDOM TOOTH EXTRACTION      Current Outpatient Medications  Medication Sig Dispense Refill  . Ascorbic Acid (VITAMIN C) 100 MG tablet Take 500 mg by mouth daily.     . calcium carbonate (OS-CAL) 600 MG TABS tablet Take 600 mg by mouth 2 (two) times daily with a meal.    . Cinnamon 500 MG capsule Take 500 mg by mouth daily.    . Coenzyme Q10  (CO Q-10 PO) Take 1 tablet by mouth daily.    . Cyanocobalamin (B-12) 500 MCG TABS Take 1 tablet by mouth daily.    Marland Kitchen dicyclomine (BENTYL) 20 MG tablet Take 1 tablet by mouth as needed.  5  . Fexofenadine HCl (ALLEGRA ALLERGY PO) Take 1 tablet by mouth as needed.     Marland Kitchen ibuprofen (ADVIL,MOTRIN) 200 MG tablet Take 200 mg by mouth every 6 (six) hours as needed.    . lactobacillus acidophilus (BACID) TABS tablet Take 2 tablets by mouth 3 (three) times daily.    Marland Kitchen levonorgestrel (MIRENA) 20 MCG/24HR IUD 1 each by Intrauterine route once.    . meclizine (ANTIVERT) 25 MG tablet Take 25 mg by mouth 3 (three) times daily as needed for dizziness.    . melatonin 3 MG TABS tablet 1 tablet at bedtime as needed with food    . Methylcellulose, Laxative, (CITRUCEL PO) Take by mouth as needed.     . Multiple Vitamin (MULTIVITAMIN) capsule Take 1 capsule by mouth daily.    . SUMAtriptan (IMITREX) 25 MG tablet Take 25 mg by mouth every 2 (two) hours as needed for migraine. May repeat in 2 hours if headache persists or recurs.     No current facility-administered medications for  this visit.    Family History  Problem Relation Age of Onset  . Hypertension Father   . Breast cancer Maternal Aunt        also with fallopian tube CA--DEC  . Diabetes Maternal Grandmother   . Hypertension Maternal Grandmother   . Thyroid disease Maternal Grandmother   . Hypertension Maternal Grandfather   . Stroke Maternal Grandfather   . Hypertension Paternal Grandmother   . Hypertension Paternal Grandfather     Review of Systems  Psychiatric/Behavioral: Positive for sleep disturbance (Melatonin not helping).  All other systems reviewed and are negative.   Exam:   BP 122/72   Pulse 70   Resp 16   Ht 5\' 5"  (1.651 m)   Wt 166 lb (75.3 kg)   BMI 27.62 kg/m     General appearance: alert, cooperative and appears stated age Head: normocephalic, without obvious abnormality, atraumatic Neck: no adenopathy, supple,  symmetrical, trachea midline and thyroid normal to inspection and palpation Lungs: clear to auscultation bilaterally Breasts: normal appearance, no masses or tenderness, No nipple retraction or dimpling, No nipple discharge or bleeding, No axillary adenopathy Heart: regular rate and rhythm Abdomen: soft, non-tender; no masses, no organomegaly Extremities: extremities normal, atraumatic, no cyanosis or edema Skin: skin color, texture, turgor normal. No rashes or lesions Lymph nodes: cervical, supraclavicular, and axillary nodes normal. Neurologic: grossly normal  Pelvic: External genitalia:  no lesions              No abnormal inguinal nodes palpated.              Urethra:  normal appearing urethra with no masses, tenderness or lesions              Bartholins and Skenes: normal                 Vagina: normal appearing vagina with normal color and discharge, no lesions              Cervix: no lesions.  IUD strings noted.              Pap taken: No. Bimanual Exam:  Uterus:  normal size, contour, position, consistency, mobility, non-tender              Adnexa: no mass, fullness, tenderness              Rectal exam: Yes.  .  Confirms.              Anus:  normal sphincter tone, no lesions  Chaperone was present for exam.  Assessment:   Well woman visit with normal exam. Mirena IUD patient.  Migraines. Menopausal symptoms. Sleep disturbance.  Life stress.   Plan: Mammogram screening discussed. Self breast awareness reviewed. Pap and HR HPV as above. Guidelines for Calcium, Vitamin D, regular exercise program including cardiovascular and weight bearing exercise. I discussed briefly estrogen, SSRI/SNRI, and gabapentin.  Effexor XR 37.5 mg daily.   Potential side effects discussed.  Fu in 6 weeks.  Follow up annually and prn.

## 2020-04-26 NOTE — Patient Instructions (Signed)

## 2020-06-05 NOTE — Progress Notes (Signed)
GYNECOLOGY  VISIT   HPI: 50 y.o.   Married  Caucasian  female   [phone] with No LMP recorded. (Menstrual status: IUD).   here for medication follow up.    Patient started Effexor XR 37.5 mg for treating menopausal symptoms, sleep disturbance, and life stress.   Is able to sleep better but still having some thoughts that keep her awake.  Still having some stresses.  No insomnia.  Good energy level, no real change.  Feeling like she is carrying on like she always was.  She has occasional night sweats but not consistent.   Some dizziness at first.   Feels ok with the medication and would like to continue.  Asking about long term effects of Effexor XR.   GYNECOLOGIC HISTORY: No LMP recorded. (Menstrual status: IUD). Contraception: Mirena IUD 12/07/18 Menopausal hormone therapy:  none Last mammogram:  10/2019 normal with [facility] per patient Last pap smear: 02-19-17 Neg:Neg HR HPV, 11-23-14 Neg:Neg HR HPV, 11-02-12 Neg:Neg HR HPV        OB History    Gravida  3   Para  3   Term  3   Preterm      AB      Living  3     SAB      IAB      Ectopic      Multiple      Live Births                 Patient Active Problem List   Diagnosis Date Noted  . Menorrhagia with irregular cycle 07/29/2013    Past Medical History:  Diagnosis Date  . IBS (irritable bowel syndrome)   . Inner ear disease   . Migraine     Past Surgical History:  Procedure Laterality Date  . INTRAUTERINE DEVICE (IUD) INSERTION  12/22/13   Mirena  . NASAL SEPTUM SURGERY  1988  . NASAL SINUS SURGERY  1988, 1990  . WISDOM TOOTH EXTRACTION      Current Outpatient Medications  Medication Sig Dispense Refill  . Ascorbic Acid (VITAMIN C) 100 MG tablet Take 500 mg by mouth daily.     . calcium carbonate (OS-CAL) 600 MG TABS tablet Take 600 mg by mouth 2 (two) times daily with a meal.    . Cinnamon 500 MG capsule Take 500 mg by mouth daily.    . Coenzyme Q10 (CO Q-10 PO) Take 1 tablet by mouth  daily.    . Cyanocobalamin (B-12) 500 MCG TABS Take 1 tablet by mouth daily.    Marland Kitchen dicyclomine (BENTYL) 20 MG tablet Take 1 tablet by mouth as needed.  5  . Fexofenadine HCl (ALLEGRA ALLERGY PO) Take 1 tablet by mouth as needed.     Marland Kitchen ibuprofen (ADVIL,MOTRIN) 200 MG tablet Take 200 mg by mouth every 6 (six) hours as needed.    . lactobacillus acidophilus (BACID) TABS tablet Take 2 tablets by mouth 3 (three) times daily.    Marland Kitchen levonorgestrel (MIRENA) 20 MCG/24HR IUD 1 each by Intrauterine route once.    . meclizine (ANTIVERT) 25 MG tablet Take 25 mg by mouth 3 (three) times daily as needed for dizziness.    . melatonin 3 MG TABS tablet 1 tablet at bedtime as needed with food    . Methylcellulose, Laxative, (CITRUCEL PO) Take by mouth as needed.     . Multiple Vitamin (MULTIVITAMIN) capsule Take 1 capsule by mouth daily.    . SUMAtriptan (IMITREX) 25 MG  tablet Take 25 mg by mouth every 2 (two) hours as needed for migraine. May repeat in 2 hours if headache persists or recurs.    . venlafaxine XR (EFFEXOR XR) 37.5 MG 24 hr capsule Take 1 capsule (37.5 mg total) by mouth daily. 30 capsule 1   No current facility-administered medications for this visit.     ALLERGIES: Erythromycin and Polymyxin b  Family History  Problem Relation Age of Onset  . Hypertension Father   . Breast cancer Maternal Aunt        also with fallopian tube CA--DEC  . Diabetes Maternal Grandmother   . Hypertension Maternal Grandmother   . Thyroid disease Maternal Grandmother   . Hypertension Maternal Grandfather   . Stroke Maternal Grandfather   . Hypertension Paternal Grandmother   . Hypertension Paternal Grandfather     Social History   Socioeconomic History  . Marital status: Married    Spouse name: Jilian West  . Number of children: 3  . Years of education: College  . Highest education level: Not on file  Occupational History    Employer: [employer]    Comment: Nature conservation officer  Tobacco Use  .  Smoking status: Never Smoker  . Smokeless tobacco: Never Used  Vaping Use  . Vaping Use: Never used  Substance and Sexual Activity  . Alcohol use: No    Alcohol/week: 0.0 standard drinks  . Drug use: No  . Sexual activity: Yes    Partners: Male    Birth control/protection: I.U.D.    Comment: Mirena Insertion 12-07-18  Other Topics Concern  . Not on file  Social History Narrative   Patient lives at home with her family.   Caffeine Use: 1-2 cups daily   Social Determinants of Health   Financial Resource Strain: Not on file  Food Insecurity: Not on file  Transportation Needs: Not on file  Physical Activity: Not on file  Stress: Not on file  Social Connections: Not on file  Intimate Partner Violence: Not on file    Review of Systems  All other systems reviewed and are negative.   PHYSICAL EXAMINATION:    BP (!) 166/90   Pulse 78   Ht 5' 5.5" (1.664 m)   Wt 166 lb (75.3 kg)   SpO2 99%   BMI 27.20 kg/m     General appearance: alert, cooperative and appears stated age   ASSESSMENT  Situational stress.  Perimenopause.  Encounter for medication monitoring.  Doing well on Effexor.   PLAN  Will continue Effexor XR 37.5 mg daily.  We talked about treatment for a minimum of 6 months, but I refilled for a full year.  We reviewed the weaning process for Effexor XR.  I encouraged stress reduction through healthy diet and exercise. Counseling and life coaching also discussed.  FU prn.    25 min  total time was spent for this patient encounter, including preparation, face-to-face counseling with the patient, coordination of care, and documentation of the encounter.

## 2020-06-07 ENCOUNTER — Encounter: Payer: Self-pay | Admitting: Obstetrics and Gynecology

## 2020-06-07 ENCOUNTER — Ambulatory Visit: Payer: BC Managed Care – PPO | Admitting: Obstetrics and Gynecology

## 2020-06-07 ENCOUNTER — Other Ambulatory Visit: Payer: Self-pay

## 2020-06-07 VITALS — BP 166/90 | HR 78 | Ht 65.5 in | Wt 166.0 lb

## 2020-06-07 DIAGNOSIS — Z5181 Encounter for therapeutic drug level monitoring: Secondary | ICD-10-CM | POA: Diagnosis not present

## 2020-06-07 DIAGNOSIS — F439 Reaction to severe stress, unspecified: Secondary | ICD-10-CM

## 2020-06-07 MED ORDER — VENLAFAXINE HCL ER 37.5 MG PO CP24: 37.5000 mg | ORAL_CAPSULE | Freq: Every day | ORAL | 3 refills | Status: DC

## 2020-09-06 ENCOUNTER — Telehealth: Payer: Self-pay | Admitting: Internal Medicine

## 2020-09-06 MED ORDER — NIRMATRELVIR/RITONAVIR (PAXLOVID)TABLET: 3.0000 | ORAL_TABLET | Freq: Two times a day (BID) | ORAL | 0 refills | Status: AC

## 2020-09-06 NOTE — Telephone Encounter (Signed)
Tested positive for Covid and symptomatic with fever, cough malaise I sent Paxlovid to her [facility]

## 2020-12-04 DIAGNOSIS — R946 Abnormal results of thyroid function studies: Secondary | ICD-10-CM | POA: Diagnosis not present

## 2020-12-04 DIAGNOSIS — E7800 Pure hypercholesterolemia, unspecified: Secondary | ICD-10-CM | POA: Diagnosis not present

## 2020-12-04 DIAGNOSIS — Z0000 Encounter for general adult medical examination without abnormal findings: Secondary | ICD-10-CM | POA: Diagnosis not present

## 2020-12-19 DIAGNOSIS — Z1231 Encounter for screening mammogram for malignant neoplasm of breast: Secondary | ICD-10-CM | POA: Diagnosis not present

## 2020-12-22 DIAGNOSIS — H44009 Unspecified purulent endophthalmitis, unspecified eye: Secondary | ICD-10-CM | POA: Diagnosis not present

## 2021-01-01 DIAGNOSIS — R946 Abnormal results of thyroid function studies: Secondary | ICD-10-CM | POA: Diagnosis not present

## 2021-04-23 DIAGNOSIS — I10 Essential (primary) hypertension: Secondary | ICD-10-CM | POA: Diagnosis not present

## 2021-05-01 NOTE — Progress Notes (Signed)
51 y.o. G29P3003 Married Caucasian female here for annual exam.   ? ?Only occ. Menses with Mirena IUD. Has very light cycle every 90 days. ?Cycles last 6 - 7 days.  ? ?Some hot flashes, day or night.  ?They are manageable.  ? ?Taking Effexor XR.  ?Still dealing with stress but does not want to increase the dosage.  ? ?Some leakage with strong coughing and having some urgency with urination.  ?This is a change for her.  ? ?Takes calcium and vit D3 daily.  ? ?PCP:   Georgann Housekeeper, MD ? ?No LMP recorded. (Menstrual status: IUD).     ?Period Cycle (Days):  (Has Mirena IUD) ?Period Pattern: (!) Irregular (with Mirena IUD) ?Menstrual Flow: Light ?    ?Sexually active: Yes.    ?The current method of family planning is IUD--Mirena 12/07/18.    ?Exercising: No.   Some walking ?Smoker:  no ? ?Health Maintenance: ?Pap:   02-19-17 Neg:Neg HR HPV, 11-23-14 Neg:Neg HR HPV, 11-02-12 Neg:Neg HR HPV ?History of abnormal Pap:  no ?MMG:  11/2020 ?Colonoscopy:  02-24-19 normal per patient;next 10 years ?BMD:  Years ago  Result :Normal per patient ?TDaP:  PCP ?Gardasil:   n/a ?HIV: Neg in preg ?Hep C: no ?Screening Labs: PCP ? ? reports that she has never smoked. She has never used smokeless tobacco. She reports that she does not drink alcohol and does not use drugs. ? ?Past Medical History:  ?Diagnosis Date  ? Hypertension   ? IBS (irritable bowel syndrome)   ? Inner ear disease   ? Migraine   ? ? ?Past Surgical History:  ?Procedure Laterality Date  ? INTRAUTERINE DEVICE (IUD) INSERTION  12/22/13  ? Mirena  ? NASAL SEPTUM SURGERY  1988  ? NASAL SINUS SURGERY  1988, 1990  ? WISDOM TOOTH EXTRACTION    ? ? ?Current Outpatient Medications  ?Medication Sig Dispense Refill  ? ALPRAZolam (XANAX) 0.25 MG tablet Take 0.25 mg by mouth daily as needed.    ? Ascorbic Acid (VITAMIN C) 100 MG tablet Take 500 mg by mouth daily.     ? calcium carbonate (OS-CAL) 600 MG TABS tablet Take 600 mg by mouth 2 (two) times daily with a meal.    ? Cinnamon 500 MG  capsule Take 500 mg by mouth daily.    ? Coenzyme Q10 (CO Q-10 PO) Take 1 tablet by mouth daily.    ? Cyanocobalamin (B-12) 500 MCG TABS Take 1 tablet by mouth daily.    ? dicyclomine (BENTYL) 20 MG tablet Take 1 tablet by mouth as needed.  5  ? Fexofenadine HCl (ALLEGRA ALLERGY PO) Take 1 tablet by mouth as needed.     ? ibuprofen (ADVIL,MOTRIN) 200 MG tablet Take 200 mg by mouth every 6 (six) hours as needed.    ? lactobacillus acidophilus (BACID) TABS tablet Take 2 tablets by mouth 3 (three) times daily.    ? levonorgestrel (MIRENA) 20 MCG/24HR IUD 1 each by Intrauterine route once.    ? meclizine (ANTIVERT) 25 MG tablet Take 25 mg by mouth 3 (three) times daily as needed for dizziness.    ? melatonin 3 MG TABS tablet 1 tablet at bedtime as needed with food    ? Methylcellulose, Laxative, (CITRUCEL PO) Take by mouth as needed.     ? Multiple Vitamin (MULTIVITAMIN) capsule Take 1 capsule by mouth daily.    ? SUMAtriptan (IMITREX) 25 MG tablet Take 25 mg by mouth every 2 (two) hours as  needed for migraine. May repeat in 2 hours if headache persists or recurs.    ? valsartan-hydrochlorothiazide (DIOVAN-HCT) 80-12.5 MG tablet 1 tablet    ? venlafaxine XR (EFFEXOR-XR) 37.5 MG 24 hr capsule 1 capsule with food    ? ?No current facility-administered medications for this visit.  ? ? ?Family History  ?Problem Relation Age of Onset  ? Hypertension Father   ? Breast cancer Maternal Aunt   ?     also with fallopian tube CA--DEC  ? Diabetes Maternal Grandmother   ? Hypertension Maternal Grandmother   ? Thyroid disease Maternal Grandmother   ? Hypertension Maternal Grandfather   ? Stroke Maternal Grandfather   ? Hypertension Paternal Grandmother   ? Hypertension Paternal Grandfather   ? ? ?Review of Systems  ?All other systems reviewed and are negative. ? ?Exam:   ?BP 140/83   Pulse 86   Ht 5\' 5"  (1.651 m)   Wt 170 lb (77.1 kg)   SpO2 98%   BMI 28.29 kg/m?     ?General appearance: alert, cooperative and appears stated  age ?Head: normocephalic, without obvious abnormality, atraumatic ?Neck: no adenopathy, supple, symmetrical, trachea midline and thyroid normal to inspection and palpation ?Lungs: clear to auscultation bilaterally ?Breasts: normal appearance, no masses or tenderness, No nipple retraction or dimpling, No nipple discharge or bleeding, No axillary adenopathy ?Heart: regular rate and rhythm ?Abdomen: soft, non-tender; no masses, no organomegaly ?Extremities: extremities normal, atraumatic, no cyanosis or edema ?Skin: skin color, texture, turgor normal. No rashes or lesions ?Lymph nodes: cervical, supraclavicular, and axillary nodes normal. ?Neurologic: grossly normal ? ?Pelvic: External genitalia:  no lesions ?             No abnormal inguinal nodes palpated. ?             Urethra:  normal appearing urethra with no masses, tenderness or lesions ?             Bartholins and Skenes: normal    ?             Vagina: normal appearing vagina with normal color and discharge, no lesions ?             Cervix: no lesions.  IUD strings noted. ?             Pap taken: No. ?Bimanual Exam:  Uterus:  normal size, contour, position, consistency, mobility, non-tender ?             Adnexa: no mass, fullness, tenderness ?             Rectal exam: yes.  Confirms. ?             Anus:  normal sphincter tone, no lesions ? ?Chaperone was present for exam:  , CMA ? ?Assessment:   ?Well woman visit with gynecologic exam. ?Mirena IUD patient.  ?Migraines.  ?Menopausal symptoms. ?Life stress.  ?Stress incontinence. ? ?Plan: ?Mammogram screening discussed. ?Self breast awareness reviewed. ?Pap and HR HPV 2014. ?Guidelines for Calcium, Vitamin D, regular exercise program including cardiovascular and weight bearing exercise. ?Effexor XR 37.5 mg daily.   ?We discussed potential counseling/coaching.  She will consider. ?Kegel's reviewed.  ?We discussed potential pelvic floor therapy. ?Follow up annually and prn.  ? ?After visit summary provided.   ? ? ? ?

## 2021-05-02 ENCOUNTER — Encounter: Payer: Self-pay | Admitting: Obstetrics and Gynecology

## 2021-05-02 ENCOUNTER — Ambulatory Visit (INDEPENDENT_AMBULATORY_CARE_PROVIDER_SITE_OTHER): Payer: BC Managed Care – PPO | Admitting: Obstetrics and Gynecology

## 2021-05-02 VITALS — BP 140/83 | HR 86 | Ht 65.0 in | Wt 170.0 lb

## 2021-05-02 DIAGNOSIS — Z01419 Encounter for gynecological examination (general) (routine) without abnormal findings: Secondary | ICD-10-CM

## 2021-05-02 MED ORDER — VENLAFAXINE HCL ER 37.5 MG PO CP24: ORAL_CAPSULE | ORAL | 3 refills | Status: DC

## 2021-05-02 NOTE — Patient Instructions (Signed)
EXERCISE AND DIET:  We recommended that you start or continue a regular exercise program for good health. Regular exercise means any activity that makes your heart beat faster and makes you sweat.  We recommend exercising at least 30 minutes per day at least 3 days a week, preferably 4 or 5.  We also recommend a diet low in fat and sugar.  Inactivity, poor dietary choices and obesity can cause diabetes, heart attack, stroke, and kidney damage, among others.   ? ?ALCOHOL AND SMOKING:  Women should limit their alcohol intake to no more than 7 drinks/beers/glasses of wine (combined, not each!) per week. Moderation of alcohol intake to this level decreases your risk of breast cancer and liver damage. And of course, no recreational drugs are part of a healthy lifestyle.  And absolutely no smoking or even second hand smoke. Most people know smoking can cause heart and lung diseases, but did you know it also contributes to weakening of your bones? Aging of your skin?  Yellowing of your teeth and nails? ? ?CALCIUM AND VITAMIN D:  Adequate intake of calcium and Vitamin D are recommended.  The recommendations for exact amounts of these supplements seem to change often, but generally speaking 600 mg of calcium (either carbonate or citrate) and 800 units of Vitamin D per day seems prudent. Certain women may benefit from higher intake of Vitamin D.  If you are among these women, your doctor will have told you during your visit.   ? ?PAP SMEARS:  Pap smears, to check for cervical cancer or precancers,  have traditionally been done yearly, although recent scientific advances have shown that most women can have pap smears less often.  However, every woman still should have a physical exam from her gynecologist every year. It will include a breast check, inspection of the vulva and vagina to check for abnormal growths or skin changes, a visual exam of the cervix, and then an exam to evaluate the size and shape of the uterus and  ovaries.  And after 53 years of age, a rectal exam is indicated to check for rectal cancers. We will also provide age appropriate advice regarding health maintenance, like when you should have certain vaccines, screening for sexually transmitted diseases, bone density testing, colonoscopy, mammograms, etc.  ? ?MAMMOGRAMS:  All women over 45 years old should have a yearly mammogram. Many facilities now offer a "3D" mammogram, which may cost around $50 extra out of pocket. If possible,  we recommend you accept the option to have the 3D mammogram performed.  It both reduces the number of women who will be called back for extra views which then turn out to be normal, and it is better than the routine mammogram at detecting truly abnormal areas.   ? ?COLONOSCOPY:  Colonoscopy to screen for colon cancer is recommended for all women at age 18.  We know, you hate the idea of the prep.  We agree, BUT, having colon cancer and not knowing it is worse!!  Colon cancer so often starts as a polyp that can be seen and removed at colonscopy, which can quite literally save your life!  And if your first colonoscopy is normal and you have no family history of colon cancer, most women don't have to have it again for 10 years.  Once every ten years, you can do something that may end up saving your life, right?  We will be happy to help you get it scheduled when you are ready.  Be sure to check your insurance coverage so you understand how much it will cost.  It may be covered as a preventative service at no cost, but you should check your particular policy.   ? ?Kegel Exercises ?Kegel exercises can help strengthen your pelvic floor muscles. The pelvic floor is a group of muscles that support your rectum, small intestine, and bladder. In females, pelvic floor muscles also help support the uterus. These muscles help you control the flow of urine and stool (feces). ?Kegel exercises are painless and simple. They do not require any equipment.  Your provider may suggest Kegel exercises to: ?Improve bladder and bowel control. ?Improve sexual response. ?Improve weak pelvic floor muscles after surgery to remove the uterus (hysterectomy) or after pregnancy, in females. ?Improve weak pelvic floor muscles after prostate gland removal or surgery, in males. ?Kegel exercises involve squeezing your pelvic floor muscles. These are the same muscles you squeeze when you try to stop the flow of urine or keep from passing gas. The exercises can be done while sitting, standing, or lying down, but it is best to vary your position. ?Ask your health care provider which exercises are safe for you. Do exercises exactly as told by your health care provider and adjust them as directed. Do not begin these exercises until told by your health care provider. ?Exercises ?How to do Kegel exercises: ?Squeeze your pelvic floor muscles tight. You should feel a tight lift in your rectal area. If you are a female, you should also feel a tightness in your vaginal area. Keep your stomach, buttocks, and legs relaxed. ?Hold the muscles tight for up to 10 seconds. ?Breathe normally. ?Relax your muscles for up to 10 seconds. ?Repeat as told by your health care provider. ?Repeat this exercise daily as told by your health care provider. Continue to do this exercise for at least 4-6 weeks, or for as long as told by your health care provider. ?You may be referred to a physical therapist who can help you learn more about how to do Kegel exercises. ?Depending on your condition, your health care provider may recommend: ?Varying how long you squeeze your muscles. ?Doing several sets of exercises every day. ?Doing exercises for several weeks. ?Making Kegel exercises a part of your regular exercise routine. ?This information is not intended to replace advice given to you by your health care provider. Make sure you discuss any questions you have with your health care provider. ?Document Revised: 05/18/2020  Document Reviewed: 05/18/2020 ?Elsevier Patient Education ? 2022 [employer] ? ?

## 2021-05-08 DIAGNOSIS — I10 Essential (primary) hypertension: Secondary | ICD-10-CM | POA: Diagnosis not present

## 2021-05-22 ENCOUNTER — Encounter: Payer: Self-pay | Admitting: Obstetrics and Gynecology

## 2021-07-27 DIAGNOSIS — B360 Pityriasis versicolor: Secondary | ICD-10-CM | POA: Diagnosis not present

## 2021-07-27 DIAGNOSIS — L7211 Pilar cyst: Secondary | ICD-10-CM | POA: Diagnosis not present

## 2021-07-27 DIAGNOSIS — L573 Poikiloderma of Civatte: Secondary | ICD-10-CM | POA: Diagnosis not present

## 2021-07-27 DIAGNOSIS — L578 Other skin changes due to chronic exposure to nonionizing radiation: Secondary | ICD-10-CM | POA: Diagnosis not present

## 2021-07-30 DIAGNOSIS — I10 Essential (primary) hypertension: Secondary | ICD-10-CM | POA: Diagnosis not present

## 2021-12-25 DIAGNOSIS — Z1231 Encounter for screening mammogram for malignant neoplasm of breast: Secondary | ICD-10-CM | POA: Diagnosis not present

## 2021-12-28 ENCOUNTER — Encounter: Payer: Self-pay | Admitting: Obstetrics and Gynecology

## 2021-12-31 DIAGNOSIS — I10 Essential (primary) hypertension: Secondary | ICD-10-CM | POA: Diagnosis not present

## 2021-12-31 DIAGNOSIS — Z23 Encounter for immunization: Secondary | ICD-10-CM | POA: Diagnosis not present

## 2021-12-31 DIAGNOSIS — E7800 Pure hypercholesterolemia, unspecified: Secondary | ICD-10-CM | POA: Diagnosis not present

## 2021-12-31 DIAGNOSIS — Z0000 Encounter for general adult medical examination without abnormal findings: Secondary | ICD-10-CM | POA: Diagnosis not present

## 2022-03-04 DIAGNOSIS — E7800 Pure hypercholesterolemia, unspecified: Secondary | ICD-10-CM | POA: Diagnosis not present

## 2022-04-15 DIAGNOSIS — E039 Hypothyroidism, unspecified: Secondary | ICD-10-CM | POA: Diagnosis not present

## 2022-04-24 NOTE — Progress Notes (Signed)
52 y.o. [id number] Married Caucasian female here for annual exam.    Rare vaginal spotting.   Struggling with her weight.   Taking Effexor for stress at home.  States she is safe. Health issues with family members.  The current dosage is not helping much. This does help her hot flashes.  Taking new mediation for HTN and elevated cholesterol.   She had an elevated TSH and will do a recheck of this next week.   PCP:   Dr. Donette Larry  No LMP recorded. (Menstrual status: IUD).           Sexually active: Yes.    The current method of family planning is IUD--Mirena 12/07/18.    Exercising: Yes.     walking Smoker:  no  Health Maintenance: Pap:  02/19/17 neg, 11/23/14 neg History of abnormal Pap:  no MMG:  12/25/21 Breast Density Cat C, BI-RADS CAT 1 neg Colonoscopy:  02/24/19 normal per pt BMD:   years ago  Result  normal per pt TDaP:  PCP Gardasil:   no HIV: n/a Hep C: n/a Screening Labs:  PCP   reports that she has never smoked. She has never used smokeless tobacco. She reports that she does not drink alcohol and does not use drugs.  Past Medical History:  Diagnosis Date   Hypertension    IBS (irritable bowel syndrome)    Inner ear disease    Migraine     Past Surgical History:  Procedure Laterality Date   INTRAUTERINE DEVICE (IUD) INSERTION  12/22/13   Mirena   NASAL SEPTUM SURGERY  1988   NASAL SINUS SURGERY  1988, 1990   WISDOM TOOTH EXTRACTION      Current Outpatient Medications  Medication Sig Dispense Refill   Ascorbic Acid (VITAMIN C) 100 MG tablet Take 500 mg by mouth daily.      atorvastatin (LIPITOR) 10 MG tablet Take 10 mg by mouth 3 (three) times a week.     calcium carbonate (OS-CAL) 600 MG TABS tablet Take 600 mg by mouth 2 (two) times daily with a meal.     Cinnamon 500 MG capsule Take 500 mg by mouth daily.     Coenzyme Q10 (CO Q-10 PO) Take 1 tablet by mouth daily.     Cyanocobalamin (B-12) 500 MCG TABS Take 1 tablet by mouth daily.     dicyclomine  (BENTYL) 20 MG tablet Take 1 tablet by mouth as needed.  5   Fexofenadine HCl (ALLEGRA ALLERGY PO) Take 1 tablet by mouth as needed.      ibuprofen (ADVIL,MOTRIN) 200 MG tablet Take 200 mg by mouth every 6 (six) hours as needed.     lactobacillus acidophilus (BACID) TABS tablet Take 2 tablets by mouth 3 (three) times daily.     levonorgestrel (MIRENA) 20 MCG/24HR IUD 1 each by Intrauterine route once.     melatonin 3 MG TABS tablet 1 tablet at bedtime as needed with food     Methylcellulose, Laxative, (CITRUCEL PO) Take by mouth as needed.      Multiple Vitamin (MULTIVITAMIN) capsule Take 1 capsule by mouth daily.     SUMAtriptan (IMITREX) 25 MG tablet Take 25 mg by mouth every 2 (two) hours as needed for migraine. May repeat in 2 hours if headache persists or recurs.     valsartan-hydrochlorothiazide (DIOVAN-HCT) 80-12.5 MG tablet 1 tablet     venlafaxine XR (EFFEXOR-XR) 37.5 MG 24 hr capsule Take one capsule by mouth daily. 90 capsule 3   ALPRAZolam (  XANAX) 0.25 MG tablet Take 0.25 mg by mouth daily as needed. (Patient not taking: Reported on 05/08/2022)     meclizine (ANTIVERT) 25 MG tablet Take 25 mg by mouth 3 (three) times daily as needed for dizziness. (Patient not taking: Reported on 05/08/2022)     No current facility-administered medications for this visit.    Family History  Problem Relation Age of Onset   Hypertension Father    Breast cancer Maternal Aunt        also with fallopian tube CA--DEC   Diabetes Maternal Grandmother    Hypertension Maternal Grandmother    Thyroid disease Maternal Grandmother    Hypertension Maternal Grandfather    Stroke Maternal Grandfather    Hypertension Paternal Grandmother    Hypertension Paternal Grandfather     Review of Systems  All other systems reviewed and are negative.   Exam:   BP 122/84 (BP Location: Left Arm, Patient Position: Sitting, Cuff Size: Normal)   Pulse 71   Ht 5' 5.5" (1.664 m)   Wt 171 lb (77.6 kg)   SpO2 99%    BMI 28.02 kg/m     General appearance: alert, cooperative and appears stated age Head: normocephalic, without obvious abnormality, atraumatic Neck: no adenopathy, supple, symmetrical, trachea midline and thyroid normal to inspection and palpation Lungs: clear to auscultation bilaterally Breasts: normal appearance, no masses or tenderness, No nipple retraction or dimpling, No nipple discharge or bleeding, No axillary adenopathy Heart: regular rate and rhythm Abdomen: soft, non-tender; no masses, no organomegaly Extremities: extremities normal, atraumatic, no cyanosis or edema Skin: skin color, texture, turgor normal. No rashes or lesions Lymph nodes: cervical, supraclavicular, and axillary nodes normal. Neurologic: grossly normal  Pelvic: External genitalia:  no lesions              No abnormal inguinal nodes palpated.              Urethra:  normal appearing urethra with no masses, tenderness or lesions              Bartholins and Skenes: normal                 Vagina: normal appearing vagina with normal color and discharge, no lesions              Cervix: no lesions.  IUD strings noted.              Pap taken: yes Bimanual Exam:  Uterus:  normal size, contour, position, consistency, mobility, non-tender              Adnexa: no mass, fullness, tenderness              Rectal exam: yes.  Confirms.              Anus:  normal sphincter tone, no lesions  Chaperone was present for exam:  Warren Lacy, CMA  Assessment:   Well woman visit with gynecologic exam. Mirena IUD patient.  Migraines.  Menopausal symptoms. Family stress.  Plan: Mammogram screening discussed. Self breast awareness reviewed. Pap and HR HPV collected. Guidelines for Calcium, Vitamin D, regular exercise program including cardiovascular and weight bearing exercise. Check FSH and estradiol. I discussed different groups for counseling and support.  Follow up annually and prn.   After visit summary provided.

## 2022-05-08 ENCOUNTER — Ambulatory Visit (INDEPENDENT_AMBULATORY_CARE_PROVIDER_SITE_OTHER): Payer: BC Managed Care – PPO | Admitting: Obstetrics and Gynecology

## 2022-05-08 ENCOUNTER — Encounter: Payer: Self-pay | Admitting: Obstetrics and Gynecology

## 2022-05-08 ENCOUNTER — Other Ambulatory Visit (HOSPITAL_COMMUNITY)
Admission: RE | Admit: 2022-05-08 | Discharge: 2022-05-08 | Disposition: A | Discharge status: Home / Self Care | Payer: BLUE CROSS/BLUE SHIELD | Source: Ambulatory Visit | Attending: Obstetrics and Gynecology | Admitting: Obstetrics and Gynecology

## 2022-05-08 VITALS — BP 122/84 | HR 71 | Ht 65.5 in | Wt 171.0 lb

## 2022-05-08 DIAGNOSIS — N951 Menopausal and female climacteric states: Secondary | ICD-10-CM | POA: Diagnosis not present

## 2022-05-08 DIAGNOSIS — R946 Abnormal results of thyroid function studies: Secondary | ICD-10-CM | POA: Diagnosis not present

## 2022-05-08 DIAGNOSIS — N921 Excessive and frequent menstruation with irregular cycle: Secondary | ICD-10-CM | POA: Diagnosis not present

## 2022-05-08 DIAGNOSIS — Z124 Encounter for screening for malignant neoplasm of cervix: Secondary | ICD-10-CM

## 2022-05-08 DIAGNOSIS — Z01419 Encounter for gynecological examination (general) (routine) without abnormal findings: Secondary | ICD-10-CM | POA: Diagnosis not present

## 2022-05-08 DIAGNOSIS — R61 Generalized hyperhidrosis: Secondary | ICD-10-CM | POA: Diagnosis not present

## 2022-05-08 MED ORDER — VENLAFAXINE HCL ER 75 MG PO CP24: ORAL_CAPSULE | ORAL | 11 refills | Status: DC

## 2022-05-08 NOTE — Patient Instructions (Signed)

## 2022-05-08 NOTE — Addendum Note (Signed)
Addended by: Ardell Isaacs, Debbe Bales E on: 05/08/2022 10:18 AM   Modules accepted: Orders

## 2022-05-09 LAB — FOLLICLE STIMULATING HORMONE: FSH: 15.1 m[IU]/mL

## 2022-05-09 LAB — ESTRADIOL: Estradiol: 47 pg/mL

## 2022-05-10 LAB — CYTOLOGY - PAP
Comment: NEGATIVE
Diagnosis: NEGATIVE
High risk HPV: NEGATIVE

## 2022-05-13 DIAGNOSIS — M25562 Pain in left knee: Secondary | ICD-10-CM | POA: Diagnosis not present

## 2022-05-13 DIAGNOSIS — M2242 Chondromalacia patellae, left knee: Secondary | ICD-10-CM | POA: Diagnosis not present

## 2022-05-13 DIAGNOSIS — M25561 Pain in right knee: Secondary | ICD-10-CM | POA: Diagnosis not present

## 2022-05-13 DIAGNOSIS — M2392 Unspecified internal derangement of left knee: Secondary | ICD-10-CM | POA: Diagnosis not present

## 2022-05-16 DIAGNOSIS — E039 Hypothyroidism, unspecified: Secondary | ICD-10-CM | POA: Diagnosis not present

## 2022-06-05 DIAGNOSIS — R252 Cramp and spasm: Secondary | ICD-10-CM | POA: Diagnosis not present

## 2022-06-05 DIAGNOSIS — M79604 Pain in right leg: Secondary | ICD-10-CM | POA: Diagnosis not present

## 2022-06-05 DIAGNOSIS — E7800 Pure hypercholesterolemia, unspecified: Secondary | ICD-10-CM | POA: Diagnosis not present

## 2022-06-05 DIAGNOSIS — I10 Essential (primary) hypertension: Secondary | ICD-10-CM | POA: Diagnosis not present

## 2022-06-12 DIAGNOSIS — M170 Bilateral primary osteoarthritis of knee: Secondary | ICD-10-CM | POA: Diagnosis not present

## 2022-06-26 ENCOUNTER — Other Ambulatory Visit: Payer: Self-pay | Admitting: Obstetrics and Gynecology

## 2022-06-26 DIAGNOSIS — N951 Menopausal and female climacteric states: Secondary | ICD-10-CM

## 2022-06-26 NOTE — Telephone Encounter (Signed)
Med refill request: Venlafaxine ER 37.5mg  #90 with 3 refills.   Last AEX: 05/08/22 RX was sent on 05/08/22 for Venlafaxine ER 37.5mg  for #30 with 11 refills.  Pharmacy is asking for a 90 day supply instead.  Dr. Edward Jolly is out of the office this week. Refill authorized: Please Advise?

## 2022-06-28 DIAGNOSIS — L700 Acne vulgaris: Secondary | ICD-10-CM | POA: Diagnosis not present

## 2022-06-28 DIAGNOSIS — E039 Hypothyroidism, unspecified: Secondary | ICD-10-CM | POA: Diagnosis not present

## 2022-08-08 ENCOUNTER — Other Ambulatory Visit: Payer: Self-pay | Admitting: Oncology

## 2022-08-08 DIAGNOSIS — Z006 Encounter for examination for normal comparison and control in clinical research program: Secondary | ICD-10-CM

## 2022-08-14 ENCOUNTER — Other Ambulatory Visit (HOSPITAL_COMMUNITY)
Admission: RE | Admit: 2022-08-14 | Discharge: 2022-08-14 | Disposition: A | Discharge status: Home / Self Care | Payer: Self-pay | Attending: Oncology | Admitting: Oncology

## 2022-08-14 DIAGNOSIS — Z006 Encounter for examination for normal comparison and control in clinical research program: Secondary | ICD-10-CM | POA: Insufficient documentation

## 2022-08-29 DIAGNOSIS — H6122 Impacted cerumen, left ear: Secondary | ICD-10-CM | POA: Diagnosis not present

## 2022-09-04 DIAGNOSIS — M25561 Pain in right knee: Secondary | ICD-10-CM | POA: Diagnosis not present

## 2022-09-04 DIAGNOSIS — M7061 Trochanteric bursitis, right hip: Secondary | ICD-10-CM | POA: Diagnosis not present

## 2022-09-16 DIAGNOSIS — E039 Hypothyroidism, unspecified: Secondary | ICD-10-CM | POA: Diagnosis not present

## 2022-09-16 DIAGNOSIS — R221 Localized swelling, mass and lump, neck: Secondary | ICD-10-CM | POA: Diagnosis not present

## 2022-09-20 DIAGNOSIS — E041 Nontoxic single thyroid nodule: Secondary | ICD-10-CM | POA: Diagnosis not present

## 2022-09-20 DIAGNOSIS — E039 Hypothyroidism, unspecified: Secondary | ICD-10-CM | POA: Diagnosis not present

## 2022-09-26 ENCOUNTER — Other Ambulatory Visit: Payer: Self-pay | Admitting: Internal Medicine

## 2022-09-26 ENCOUNTER — Other Ambulatory Visit: Payer: Self-pay | Admitting: Physician Assistant

## 2022-09-26 DIAGNOSIS — R221 Localized swelling, mass and lump, neck: Secondary | ICD-10-CM

## 2022-09-26 DIAGNOSIS — E041 Nontoxic single thyroid nodule: Secondary | ICD-10-CM

## 2022-10-03 ENCOUNTER — Ambulatory Visit
Admission: RE | Admit: 2022-10-03 | Discharge: 2022-10-03 | Disposition: A | Discharge status: Home / Self Care | Payer: BC Managed Care – PPO | Source: Ambulatory Visit | Attending: Internal Medicine | Admitting: Internal Medicine

## 2022-10-03 DIAGNOSIS — E041 Nontoxic single thyroid nodule: Secondary | ICD-10-CM | POA: Diagnosis not present

## 2022-10-03 DIAGNOSIS — R221 Localized swelling, mass and lump, neck: Secondary | ICD-10-CM

## 2022-10-08 ENCOUNTER — Other Ambulatory Visit (HOSPITAL_COMMUNITY)
Admission: RE | Admit: 2022-10-08 | Discharge: 2022-10-08 | Disposition: A | Discharge status: Home / Self Care | Payer: BC Managed Care – PPO | Source: Ambulatory Visit | Attending: Interventional Radiology | Admitting: Interventional Radiology

## 2022-10-08 ENCOUNTER — Ambulatory Visit
Admission: RE | Admit: 2022-10-08 | Discharge: 2022-10-08 | Disposition: A | Discharge status: Home / Self Care | Payer: BC Managed Care – PPO | Source: Ambulatory Visit | Attending: Physician Assistant | Admitting: Physician Assistant

## 2022-10-08 DIAGNOSIS — E041 Nontoxic single thyroid nodule: Secondary | ICD-10-CM | POA: Diagnosis not present

## 2022-10-10 LAB — CYTOLOGY - NON PAP

## 2022-10-31 ENCOUNTER — Telehealth: Payer: Self-pay | Admitting: *Deleted

## 2022-10-31 NOTE — Telephone Encounter (Signed)
Spoke with patient. Patient reports weight gain and increase in hot flashes. Has been on effexor, would like to discuss other HRT options.   OV scheduled for 11/25/22 at 1130. Patient declined earlier OV offered due to work schedule.   Last AEX 05/08/22 MMG 12/25/21  Routing to provider for final review. Patient is agreeable to disposition. Will close encounter.

## 2022-11-11 NOTE — Progress Notes (Signed)
GYNECOLOGY  VISIT   HPI: 52 y.o.   Married  Caucasian  female   [phone] with No LMP recorded. (Menstrual status: IUD).   here for   vasomotor symptoms- wants to discuss the estradiol patch. Sweating is increasing.  Has a hand held fan she uses to reduce heat.   FSH 15.1 and estradiol 47 on 05/08/22.   Mirena IUD place on 12/07/18. No period for 8 - 12 months.   She takes Effexor XR.  Her brain is active at night and some difficulty sleeping.  She has some emotional sensitivity.   She has a thyroid nodule.   Experiencing weight gain and is considering weight loss medication.   She did genetic testing with [facility]. It was negative.    GYNECOLOGIC HISTORY: No LMP recorded. (Menstrual status: IUD). Contraception:  IUD Menopausal hormone therapy:  n/a Last mammogram:  12/25/21 Breast Density Cat C, BI-RADS CAT 1 neg  Last pap smear:   05/08/22- neg, neg HR HPV, 02/19/17 neg, 11/23/14 neg         OB History     Gravida  3   Para  3   Term  3   Preterm      AB      Living  3      SAB      IAB      Ectopic      Multiple      Live Births                 Patient Active Problem List   Diagnosis Date Noted   Menorrhagia with irregular cycle 07/29/2013    Past Medical History:  Diagnosis Date   Hypertension    IBS (irritable bowel syndrome)    Inner ear disease    Migraine     Past Surgical History:  Procedure Laterality Date   INTRAUTERINE DEVICE (IUD) INSERTION  12/22/13   Mirena   NASAL SEPTUM SURGERY  1988   NASAL SINUS SURGERY  1988, 1990   WISDOM TOOTH EXTRACTION      Current Outpatient Medications  Medication Sig Dispense Refill   ALPRAZolam (XANAX) 0.25 MG tablet Take 0.25 mg by mouth daily as needed.     Ascorbic Acid (VITAMIN C) 100 MG tablet Take 500 mg by mouth daily.      calcium carbonate (OS-CAL) 600 MG TABS tablet Take 600 mg by mouth 2 (two) times daily with a meal.     Cinnamon 500 MG capsule Take 500 mg by mouth daily.      Coenzyme Q10 (CO Q-10 PO) Take 1 tablet by mouth daily.     Cyanocobalamin (B-12) 500 MCG TABS Take 1 tablet by mouth daily.     dicyclomine (BENTYL) 20 MG tablet Take 1 tablet by mouth as needed.  5   Fexofenadine HCl (ALLEGRA ALLERGY PO) Take 1 tablet by mouth as needed.      ibuprofen (ADVIL,MOTRIN) 200 MG tablet Take 200 mg by mouth every 6 (six) hours as needed.     lactobacillus acidophilus (BACID) TABS tablet Take 2 tablets by mouth 3 (three) times daily.     levonorgestrel (MIRENA) 20 MCG/24HR IUD 1 each by Intrauterine route once.     meclizine (ANTIVERT) 25 MG tablet Take 25 mg by mouth 3 (three) times daily as needed for dizziness.     melatonin 3 MG TABS tablet 1 tablet at bedtime as needed with food     Methylcellulose, Laxative, (CITRUCEL PO) Take  by mouth as needed.      Multiple Vitamin (MULTIVITAMIN) capsule Take 1 capsule by mouth daily.     SUMAtriptan (IMITREX) 25 MG tablet Take 25 mg by mouth every 2 (two) hours as needed for migraine. May repeat in 2 hours if headache persists or recurs.     valsartan-hydrochlorothiazide (DIOVAN-HCT) 80-12.5 MG tablet 1 tablet     venlafaxine XR (EFFEXOR-XR) 37.5 MG 24 hr capsule TAKE 1 CAPSULE BY MOUTH DAILY 90 capsule 3   levothyroxine (SYNTHROID) 25 MCG tablet Take 25 mcg by mouth every morning.     pravastatin (PRAVACHOL) 20 MG tablet Take 20 mg by mouth daily.     No current facility-administered medications for this visit.     ALLERGIES: Erythromycin and Polymyxin b  Family History  Problem Relation Age of Onset   Hypertension Father    Breast cancer Maternal Aunt        also with fallopian tube CA--DEC   Diabetes Maternal Grandmother    Hypertension Maternal Grandmother    Thyroid disease Maternal Grandmother    Hypertension Maternal Grandfather    Stroke Maternal Grandfather    Hypertension Paternal Grandmother    Hypertension Paternal Grandfather     Social History   Socioeconomic History   Marital status: Married     Spouse name: Lucette Kratz   Number of children: 3   Years of education: College   Highest education level: Not on file  Occupational History    Employer: [employer]    Comment: Nature conservation officer  Tobacco Use   Smoking status: Never   Smokeless tobacco: Never  Vaping Use   Vaping status: Never Used  Substance and Sexual Activity   Alcohol use: No    Alcohol/week: 0.0 standard drinks of alcohol   Drug use: No   Sexual activity: Yes    Partners: Male    Birth control/protection: I.U.D.    Comment: Mirena Insertion 12-07-18  Other Topics Concern   Not on file  Social History Narrative   Patient lives at home with her family.   Caffeine Use: 1-2 cups daily   Social Determinants of Health   Financial Resource Strain: Not on file  Food Insecurity: Low Risk  (08/29/2022)   Received from [facility]   Hunger Vital Sign    Worried About Running Out of Food in the Last Year: Never true    Ran Out of Food in the Last Year: Never true  Transportation Needs: Not on file (08/29/2022)  Physical Activity: Not on file  Stress: Not on file  Social Connections: Not on file  Intimate Partner Violence: Not on file    Review of Systems  All other systems reviewed and are negative.   PHYSICAL EXAMINATION:    BP 136/84 (BP Location: Left Arm, Patient Position: Sitting, Cuff Size: Normal)   Pulse 99   Ht 5' 5.5" (1.664 m)   Wt 185 lb (83.9 kg)   SpO2 98%   BMI 30.32 kg/m     General appearance: alert, cooperative and appears stated age  ASSESSMENT  Menopausal symptoms.  Perimenopausal by [facility] and estradiol in April, 2024.  Mirena IUD.  Year number 4.  On Effexor.   PLAN  Recheck FSH and estradiol. We discussed perimenopause and menopause and signs and symptoms.  Start Vivelle Dot 0.05 mg twice weekly. Discussed WHI and use of HRT which can increase risk of PE, DVT, MI, stroke and breast cancer.  Benefits of HRT also reviewed:  improved  quality of life in treating  vasomotor symptoms and improving emotional lability, reduced risk of colon cancer, reduced risk of osteoporosis.   We reviewed the Mirena can be used at the progesterone side of her HRT and that in one year, progesterone supplementation will need to occur to protect her endometrium.  I recommend continuation of Effexor XR.  She will discuss potential weight loss medication with her PCP.  I recommended healthy diet and regular physical activity also to control weight.  FU in 2 - 3 months for a recheck.  35 min  total time was spent for this patient encounter, including preparation, face-to-face counseling with the patient, coordination of care, and documentation of the encounter.

## 2022-11-15 DIAGNOSIS — M25551 Pain in right hip: Secondary | ICD-10-CM | POA: Diagnosis not present

## 2022-11-15 DIAGNOSIS — M7061 Trochanteric bursitis, right hip: Secondary | ICD-10-CM | POA: Diagnosis not present

## 2022-11-25 ENCOUNTER — Ambulatory Visit: Payer: BC Managed Care – PPO | Admitting: Obstetrics and Gynecology

## 2022-11-25 ENCOUNTER — Encounter: Payer: Self-pay | Admitting: Obstetrics and Gynecology

## 2022-11-25 VITALS — BP 136/84 | HR 99 | Ht 65.5 in | Wt 185.0 lb

## 2022-11-25 DIAGNOSIS — N951 Menopausal and female climacteric states: Secondary | ICD-10-CM

## 2022-11-25 MED ORDER — ESTRADIOL 0.05 MG/24HR TD PTTW: 1.0000 | MEDICATED_PATCH | TRANSDERMAL | 0 refills | Status: DC

## 2022-11-25 NOTE — Patient Instructions (Signed)

## 2022-11-26 LAB — FOLLICLE STIMULATING HORMONE: FSH: 58.9 m[IU]/mL

## 2022-11-26 LAB — ESTRADIOL: Estradiol: <15 pg/mL

## 2022-11-27 DIAGNOSIS — M25561 Pain in right knee: Secondary | ICD-10-CM | POA: Diagnosis not present

## 2022-12-13 DIAGNOSIS — S83231A Complex tear of medial meniscus, current injury, right knee, initial encounter: Secondary | ICD-10-CM | POA: Diagnosis not present

## 2022-12-13 DIAGNOSIS — M1711 Unilateral primary osteoarthritis, right knee: Secondary | ICD-10-CM | POA: Diagnosis not present

## 2022-12-31 DIAGNOSIS — Z1231 Encounter for screening mammogram for malignant neoplasm of breast: Secondary | ICD-10-CM | POA: Diagnosis not present

## 2023-01-01 DIAGNOSIS — I10 Essential (primary) hypertension: Secondary | ICD-10-CM | POA: Diagnosis not present

## 2023-01-01 DIAGNOSIS — Z23 Encounter for immunization: Secondary | ICD-10-CM | POA: Diagnosis not present

## 2023-01-01 DIAGNOSIS — Z0000 Encounter for general adult medical examination without abnormal findings: Secondary | ICD-10-CM | POA: Diagnosis not present

## 2023-01-01 DIAGNOSIS — G43909 Migraine, unspecified, not intractable, without status migrainosus: Secondary | ICD-10-CM | POA: Diagnosis not present

## 2023-01-01 DIAGNOSIS — E7800 Pure hypercholesterolemia, unspecified: Secondary | ICD-10-CM | POA: Diagnosis not present

## 2023-01-01 DIAGNOSIS — F411 Generalized anxiety disorder: Secondary | ICD-10-CM | POA: Diagnosis not present

## 2023-01-01 DIAGNOSIS — E039 Hypothyroidism, unspecified: Secondary | ICD-10-CM | POA: Diagnosis not present

## 2023-01-24 DIAGNOSIS — M170 Bilateral primary osteoarthritis of knee: Secondary | ICD-10-CM | POA: Diagnosis not present

## 2023-01-27 ENCOUNTER — Encounter: Payer: Self-pay | Admitting: Obstetrics and Gynecology

## 2023-01-27 MED ORDER — ESTRADIOL 0.05 MG/24HR TD PTTW: 1.0000 | MEDICATED_PATCH | TRANSDERMAL | 0 refills | Status: DC

## 2023-01-27 NOTE — Telephone Encounter (Signed)
 Med refill request:estradiol 0.05 mg patch  Last AEX: 05/08/22 -BS Next AEX: OV 02/24/23 -BS Last MMG (if hormonal med) 12/25/21; BiRads 1 neg. MyChart message to patient asking to confirm last MMG Refill authorized: Please Advise?

## 2023-01-28 NOTE — Telephone Encounter (Signed)
 Call placed to [facility], copy of MMG report requested.   Encounter closed.

## 2023-01-29 ENCOUNTER — Encounter: Payer: Self-pay | Admitting: Obstetrics and Gynecology

## 2023-02-10 NOTE — Progress Notes (Signed)
GYNECOLOGY  VISIT   HPI: 53 y.o.   Married  Caucasian female   [phone] with No LMP recorded. (Menstrual status: IUD).   here for: 2-3 mo f/u on meds.  Started transdermal in November.  Doing really well with Vivelle Dot 0.05 mg twice weekly and wants to continue.  Hot flashes are reduced significantly.  Taking magnesium which is helpful for sleep.   Has some headaches but not increased above her baseline.   No bleeding.   Using Mirena for her progesterone side of her HRT. Her IUD was placed 12/07/18.  Still has stress at home.   GYNECOLOGIC HISTORY: No LMP recorded. (Menstrual status: IUD). Contraception:  IUD--Mirena 12/07/18 Menopausal hormone therapy:  n/a Last 2 paps:  05/08/22- neg, neg HR HPV, 02/19/17 neg, 11/23/14 neg  History of abnormal Pap or positive HPV:  no Mammogram:  12/31/22 Breast Density Cat C, BI-RADS CAT 1 neg         OB History     Gravida  3   Para  3   Term  3   Preterm      AB      Living  3      SAB      IAB      Ectopic      Multiple      Live Births                 Patient Active Problem List   Diagnosis Date Noted   Menorrhagia with irregular cycle 07/29/2013    Past Medical History:  Diagnosis Date   Hypertension    IBS (irritable bowel syndrome)    Inner ear disease    Migraine     Past Surgical History:  Procedure Laterality Date   INTRAUTERINE DEVICE (IUD) INSERTION  12/22/13   Mirena   NASAL SEPTUM SURGERY  1988   NASAL SINUS SURGERY  1988, 1990   WISDOM TOOTH EXTRACTION      Current Outpatient Medications  Medication Sig Dispense Refill   ALPRAZolam (XANAX) 0.25 MG tablet Take 0.25 mg by mouth daily as needed.     Ascorbic Acid (VITAMIN C) 100 MG tablet Take 500 mg by mouth daily.      calcium carbonate (OS-CAL) 600 MG TABS tablet Take 600 mg by mouth 2 (two) times daily with a meal.     Cinnamon 500 MG capsule Take 500 mg by mouth daily.     Coenzyme Q10 (CO Q-10 PO) Take 1 tablet by mouth  daily.     Cyanocobalamin (B-12) 500 MCG TABS Take 1 tablet by mouth daily.     diclofenac (VOLTAREN) 75 MG EC tablet Take 75 mg by mouth 2 (two) times daily.     dicyclomine (BENTYL) 20 MG tablet Take 1 tablet by mouth as needed.  5   estradiol (VIVELLE-DOT) 0.05 MG/24HR patch Place 1 patch (0.05 mg total) onto the skin 2 (two) times a week. 8 patch 0   Fexofenadine HCl (ALLEGRA ALLERGY PO) Take 1 tablet by mouth as needed.      ibuprofen (ADVIL,MOTRIN) 200 MG tablet Take 200 mg by mouth every 6 (six) hours as needed.     lactobacillus acidophilus (BACID) TABS tablet Take 2 tablets by mouth 3 (three) times daily.     levonorgestrel (MIRENA) 20 MCG/24HR IUD 1 each by Intrauterine route once.     levothyroxine (SYNTHROID) 25 MCG tablet Take 25 mcg by mouth every morning.     meclizine (  ANTIVERT) 25 MG tablet Take 25 mg by mouth 3 (three) times daily as needed for dizziness.     melatonin 3 MG TABS tablet 1 tablet at bedtime as needed with food     Methylcellulose, Laxative, (CITRUCEL PO) Take by mouth as needed.      Multiple Vitamin (MULTIVITAMIN) capsule Take 1 capsule by mouth daily.     phentermine 30 MG capsule Take 30 mg by mouth daily.     pravastatin (PRAVACHOL) 20 MG tablet Take 20 mg by mouth daily.     SUMAtriptan (IMITREX) 25 MG tablet Take 25 mg by mouth every 2 (two) hours as needed for migraine. May repeat in 2 hours if headache persists or recurs.     valsartan-hydrochlorothiazide (DIOVAN-HCT) 80-12.5 MG tablet 1 tablet     venlafaxine XR (EFFEXOR-XR) 37.5 MG 24 hr capsule TAKE 1 CAPSULE BY MOUTH DAILY 90 capsule 3   No current facility-administered medications for this visit.     ALLERGIES: Erythromycin and Polymyxin b  Family History  Problem Relation Age of Onset   Hypertension Father    Breast cancer Maternal Aunt        also with fallopian tube CA--DEC   Diabetes Maternal Grandmother    Hypertension Maternal Grandmother    Thyroid disease Maternal Grandmother     Hypertension Maternal Grandfather    Stroke Maternal Grandfather    Hypertension Paternal Grandmother    Hypertension Paternal Grandfather     Social History   Socioeconomic History   Marital status: Married    Spouse name: Jasiah Buntin   Number of children: 3   Years of education: College   Highest education level: Not on file  Occupational History    Employer: [employer]    Comment: Nature conservation officer  Tobacco Use   Smoking status: Never   Smokeless tobacco: Never  Vaping Use   Vaping status: Never Used  Substance and Sexual Activity   Alcohol use: No    Alcohol/week: 0.0 standard drinks of alcohol   Drug use: No   Sexual activity: Yes    Partners: Male    Birth control/protection: I.U.D.    Comment: Mirena Insertion 12-07-18  Other Topics Concern   Not on file  Social History Narrative   Patient lives at home with her family.   Caffeine Use: 1-2 cups daily   Social Drivers of Corporate investment banker Strain: Not on file  Food Insecurity: Low Risk  (08/29/2022)   Received from [facility]   Hunger Vital Sign    Worried About Running Out of Food in the Last Year: Never true    Ran Out of Food in the Last Year: Never true  Transportation Needs: Not on file (08/29/2022)  Physical Activity: Not on file  Stress: Not on file  Social Connections: Not on file  Intimate Partner Violence: Not on file    Review of Systems  All other systems reviewed and are negative.   PHYSICAL EXAMINATION:   BP 130/70 (BP Location: Left Arm, Patient Position: Sitting, Cuff Size: Small)   Pulse 94   Ht 5' 5.5" (1.664 m)   Wt 182 lb (82.6 kg)   SpO2 98%   BMI 29.83 kg/m     General appearance: alert, cooperative and appears stated age  ASSESSMENT:  Menopausal symptoms.  HRT.  Encounter for medication monitoring.  Situational stress.   PLAN:  Continue Vivelle Dot 0.05 mg twice weekly.  #24, RF one.  Will recheck [facility] in  October or November, 2025.  If menopausal  with this check, her IUD can be removed and start Prometrium.  If she is not menopausal, can continue with her current IUD and add Prometrium or switch to a new Mirena IUD.  We discussed counseling through various groups available to her. Annual exam due in April.   26 min  total time was spent for this patient encounter, including preparation, face-to-face counseling with the patient, coordination of care, and documentation of the encounter.

## 2023-02-24 ENCOUNTER — Encounter: Payer: Self-pay | Admitting: Obstetrics and Gynecology

## 2023-02-24 ENCOUNTER — Ambulatory Visit: Payer: BC Managed Care – PPO | Admitting: Obstetrics and Gynecology

## 2023-02-24 VITALS — BP 130/70 | HR 94 | Ht 65.5 in | Wt 182.0 lb

## 2023-02-24 DIAGNOSIS — Z79890 Hormone replacement therapy (postmenopausal): Secondary | ICD-10-CM

## 2023-02-24 DIAGNOSIS — Z5181 Encounter for therapeutic drug level monitoring: Secondary | ICD-10-CM

## 2023-02-24 DIAGNOSIS — N951 Menopausal and female climacteric states: Secondary | ICD-10-CM

## 2023-02-24 MED ORDER — ESTRADIOL 0.05 MG/24HR TD PTTW: 1.0000 | MEDICATED_PATCH | TRANSDERMAL | 1 refills | Status: DC

## 2023-03-18 ENCOUNTER — Telehealth: Payer: Self-pay | Admitting: *Deleted

## 2023-03-18 ENCOUNTER — Other Ambulatory Visit: Payer: Self-pay | Admitting: Obstetrics and Gynecology

## 2023-03-18 MED ORDER — ESTRADIOL 0.0375 MG/24HR TD PTTW: 1.0000 | MEDICATED_PATCH | TRANSDERMAL | 1 refills | Status: DC

## 2023-03-18 NOTE — Telephone Encounter (Signed)
 I sent in a prescription for Vivelle Dot 0.0375 mg twice weekly.  #8, RF one.

## 2023-03-18 NOTE — Telephone Encounter (Signed)
 LVMTCB x 2

## 2023-03-18 NOTE — Progress Notes (Signed)
 New dose of Vivelle Dot 0.0375 mg twice weekly.

## 2023-03-18 NOTE — Telephone Encounter (Signed)
 Spoke with patient. Patient reports headaches daily, not migraines. Denies visual changes, N/V or aura. Currently on estradiol 0.05 mg patch twice weekly and has a Mirena IUD. Medication is working well for hot flashes.   Patient asking if medication can be contributing to headaches?   Patient was seen for OV 02/24/23.   Asking for recommendations.   Advised I will forward to Dr. Edward Jolly for further review and our office will f/u with recommendations. Patient appreciative of call.   Dr. Edward Jolly -please review and advise.

## 2023-03-18 NOTE — Telephone Encounter (Signed)
 It is possible that the estrogen is contributing to headaches.   We can decrease the dose of the estradiol patch to 0.0375 and 0.025 mg twice weekly if she would like to do this.  She could also decide if she wants to stop the estrogen completely.

## 2023-03-18 NOTE — Telephone Encounter (Signed)
 Spoke w/ the pt and she said that she would like to try the next step down on dose of patches to the 0.0375.   Pt reports that pharmacy on file is correct.

## 2023-04-08 ENCOUNTER — Other Ambulatory Visit: Payer: Self-pay | Admitting: *Deleted

## 2023-04-08 MED ORDER — ESTRADIOL 0.0375 MG/24HR TD PTTW: 1.0000 | MEDICATED_PATCH | TRANSDERMAL | 0 refills | Status: DC

## 2023-04-08 NOTE — Telephone Encounter (Signed)
 Patient left message on triage line 04/07/23 providing update and requesting refill of Vivelle-Dot 0.0375 mg patch.   Patient states headaches have improved, no longer daily; hot flashes may have increased. Patient is requesting refill to pharmacy on file.   Med refill request:Vivelle-Dot 0.0375 mg  Last AEX: 05/08/22 -BS Next AEX: 05/27/23 Last MMG (if hormonal med) 12/31/22, BiRads 1, neg Refill authorized: Please Advise?

## 2023-05-02 DIAGNOSIS — L578 Other skin changes due to chronic exposure to nonionizing radiation: Secondary | ICD-10-CM | POA: Diagnosis not present

## 2023-05-02 DIAGNOSIS — L908 Other atrophic disorders of skin: Secondary | ICD-10-CM | POA: Diagnosis not present

## 2023-05-21 DIAGNOSIS — K219 Gastro-esophageal reflux disease without esophagitis: Secondary | ICD-10-CM | POA: Diagnosis not present

## 2023-05-21 DIAGNOSIS — E039 Hypothyroidism, unspecified: Secondary | ICD-10-CM | POA: Diagnosis not present

## 2023-05-21 DIAGNOSIS — E7800 Pure hypercholesterolemia, unspecified: Secondary | ICD-10-CM | POA: Diagnosis not present

## 2023-05-21 DIAGNOSIS — Z23 Encounter for immunization: Secondary | ICD-10-CM | POA: Diagnosis not present

## 2023-05-21 DIAGNOSIS — I10 Essential (primary) hypertension: Secondary | ICD-10-CM | POA: Diagnosis not present

## 2023-05-23 DIAGNOSIS — M170 Bilateral primary osteoarthritis of knee: Secondary | ICD-10-CM | POA: Diagnosis not present

## 2023-05-26 NOTE — Progress Notes (Unsigned)
 53 y.o. [id number] Married Caucasian female here for annual exam.    PCP: Jearldine Mina, MD   No LMP recorded. (Menstrual status: IUD).           Sexually active: Yes.    The current method of family planning is IUD.    Menopausal hormone therapy:  Vivelle  Exercising: {yes no:314532}  {types:19826} Smoker:  no  OB History  Gravida Para Term Preterm AB Living  3 3 3   3   SAB IAB Ectopic Multiple Live Births          # Outcome Date GA Lbr Len/2nd Weight Sex Type Anes PTL Lv  3 Term           2 Term           1 Term              HEALTH MAINTENANCE: Last 2 paps:  05/08/22 neg HR HPV neg, 02/19/17 neg HPV neg History of abnormal Pap or positive HPV:  no Mammogram:   12/31/22 Breast Density Cat C, BIRADS Cat 1 neg  Colonoscopy:   Bone Density:  n/a  Result  n/a   Immunization History  Administered Date(s) Administered   Influenza-Unspecified 10/25/2015      reports that she has never smoked. She has never used smokeless tobacco. She reports that she does not drink alcohol and does not use drugs.  Past Medical History:  Diagnosis Date   Hypertension    IBS (irritable bowel syndrome)    Inner ear disease    Migraine     Past Surgical History:  Procedure Laterality Date   INTRAUTERINE DEVICE (IUD) INSERTION  12/22/13   Mirena    NASAL SEPTUM SURGERY  1988   NASAL SINUS SURGERY  1988, 1990   WISDOM TOOTH EXTRACTION      Current Outpatient Medications  Medication Sig Dispense Refill   ALPRAZolam (XANAX) 0.25 MG tablet Take 0.25 mg by mouth daily as needed.     Ascorbic Acid (VITAMIN C) 100 MG tablet Take 500 mg by mouth daily.      calcium carbonate (OS-CAL) 600 MG TABS tablet Take 600 mg by mouth 2 (two) times daily with a meal.     Cinnamon 500 MG capsule Take 500 mg by mouth daily.     Coenzyme Q10 (CO Q-10 PO) Take 1 tablet by mouth daily.     Cyanocobalamin (B-12) 500 MCG TABS Take 1 tablet by mouth daily.     diclofenac (VOLTAREN) 75 MG EC tablet Take 75 mg by  mouth 2 (two) times daily.     dicyclomine (BENTYL) 20 MG tablet Take 1 tablet by mouth as needed.  5   estradiol  (VIVELLE -DOT) 0.0375 MG/24HR Place 1 patch onto the skin 2 (two) times a week. 24 patch 0   Fexofenadine HCl (ALLEGRA ALLERGY PO) Take 1 tablet by mouth as needed.      ibuprofen (ADVIL,MOTRIN) 200 MG tablet Take 200 mg by mouth every 6 (six) hours as needed.     lactobacillus acidophilus (BACID) TABS tablet Take 2 tablets by mouth 3 (three) times daily.     levonorgestrel  (MIRENA ) 20 MCG/24HR IUD 1 each by Intrauterine route once.     levothyroxine (SYNTHROID) 25 MCG tablet Take 25 mcg by mouth every morning.     meclizine (ANTIVERT) 25 MG tablet Take 25 mg by mouth 3 (three) times daily as needed for dizziness.     melatonin 3 MG TABS tablet 1 tablet at bedtime  as needed with food     Methylcellulose, Laxative, (CITRUCEL PO) Take by mouth as needed.      Multiple Vitamin (MULTIVITAMIN) capsule Take 1 capsule by mouth daily.     phentermine 30 MG capsule Take 30 mg by mouth daily.     pravastatin (PRAVACHOL) 20 MG tablet Take 20 mg by mouth daily.     SUMAtriptan (IMITREX) 25 MG tablet Take 25 mg by mouth every 2 (two) hours as needed for migraine. May repeat in 2 hours if headache persists or recurs.     valsartan-hydrochlorothiazide (DIOVAN-HCT) 80-12.5 MG tablet 1 tablet     venlafaxine  XR (EFFEXOR -XR) 37.5 MG 24 hr capsule TAKE 1 CAPSULE BY MOUTH DAILY 90 capsule 3   No current facility-administered medications for this visit.    ALLERGIES: Erythromycin and Polymyxin b  Family History  Problem Relation Age of Onset   Hypertension Father    Breast cancer Maternal Aunt        also with fallopian tube CA--DEC   Diabetes Maternal Grandmother    Hypertension Maternal Grandmother    Thyroid  disease Maternal Grandmother    Hypertension Maternal Grandfather    Stroke Maternal Grandfather    Hypertension Paternal Grandmother    Hypertension Paternal Grandfather     Review  of Systems  PHYSICAL EXAM:  There were no vitals taken for this visit.    General appearance: alert, cooperative and appears stated age Head: normocephalic, without obvious abnormality, atraumatic Neck: no adenopathy, supple, symmetrical, trachea midline and thyroid  normal to inspection and palpation Lungs: clear to auscultation bilaterally Breasts: normal appearance, no masses or tenderness, No nipple retraction or dimpling, No nipple discharge or bleeding, No axillary adenopathy Heart: regular rate and rhythm Abdomen: soft, non-tender; no masses, no organomegaly Extremities: extremities normal, atraumatic, no cyanosis or edema Skin: skin color, texture, turgor normal. No rashes or lesions Lymph nodes: cervical, supraclavicular, and axillary nodes normal. Neurologic: grossly normal  Pelvic: External genitalia:  no lesions              No abnormal inguinal nodes palpated.              Urethra:  normal appearing urethra with no masses, tenderness or lesions              Bartholins and Skenes: normal                 Vagina: normal appearing vagina with normal color and discharge, no lesions              Cervix: no lesions              Pap taken: {yes no:314532} Bimanual Exam:  Uterus:  normal size, contour, position, consistency, mobility, non-tender              Adnexa: no mass, fullness, tenderness              Rectal exam: {yes no:314532}.  Confirms.              Anus:  normal sphincter tone, no lesions  Chaperone was present for exam:  {BSCHAPERONE:31226::"Emily F, CMA"}  ASSESSMENT: Well woman visit with gynecologic exam.  PHQ-9: ***  ***  PLAN: Mammogram screening discussed. Self breast awareness reviewed. Pap and HRV collected:  {yes no:314532} Guidelines for Calcium, Vitamin D, regular exercise program including cardiovascular and weight bearing exercise. Medication refills:  *** {LABS (Optional):23779} Follow up:  ***    Additional counseling given.  {yes  ZO:109604}. ***  total time was spent for this patient encounter, including preparation, face-to-face counseling with the patient, coordination of care, and documentation of the encounter in addition to doing the well woman visit with gynecologic exam.

## 2023-05-27 ENCOUNTER — Encounter: Payer: Self-pay | Admitting: Obstetrics and Gynecology

## 2023-05-27 ENCOUNTER — Ambulatory Visit (INDEPENDENT_AMBULATORY_CARE_PROVIDER_SITE_OTHER): Payer: BC Managed Care – PPO | Admitting: Obstetrics and Gynecology

## 2023-05-27 VITALS — BP 116/80 | HR 81 | Ht 66.0 in | Wt 183.0 lb

## 2023-05-27 DIAGNOSIS — R3915 Urgency of urination: Secondary | ICD-10-CM | POA: Diagnosis not present

## 2023-05-27 DIAGNOSIS — Z975 Presence of (intrauterine) contraceptive device: Secondary | ICD-10-CM

## 2023-05-27 DIAGNOSIS — Z79890 Hormone replacement therapy (postmenopausal): Secondary | ICD-10-CM

## 2023-05-27 DIAGNOSIS — Z01419 Encounter for gynecological examination (general) (routine) without abnormal findings: Secondary | ICD-10-CM

## 2023-05-27 DIAGNOSIS — Z1331 Encounter for screening for depression: Secondary | ICD-10-CM | POA: Diagnosis not present

## 2023-05-27 LAB — URINALYSIS, COMPLETE W/RFL CULTURE
Bacteria, UA: NONE SEEN /HPF
Bilirubin Urine: NEGATIVE
Glucose, UA: NEGATIVE
Hgb urine dipstick: NEGATIVE
Hyaline Cast: NONE SEEN /LPF
Ketones, ur: NEGATIVE
Leukocyte Esterase: NEGATIVE
Nitrites, Initial: NEGATIVE
Protein, ur: NEGATIVE
RBC / HPF: NONE SEEN /HPF (ref 0–2)
Specific Gravity, Urine: 1.015 (ref 1.001–1.035)
WBC, UA: NONE SEEN /HPF (ref 0–5)
pH: 7 (ref 5.0–8.0)

## 2023-05-27 LAB — NO CULTURE INDICATED

## 2023-05-27 MED ORDER — OXYBUTYNIN CHLORIDE ER 10 MG PO TB24: 10.0000 mg | ORAL_TABLET | Freq: Every day | ORAL | 0 refills | Status: DC

## 2023-05-27 MED ORDER — ESTRADIOL 0.0375 MG/24HR TD PTTW: 1.0000 | MEDICATED_PATCH | TRANSDERMAL | 3 refills | Status: DC

## 2023-05-27 NOTE — Patient Instructions (Addendum)
Oxybutynin Extended-Release Tablets What is this medication? OXYBUTYNIN (ox i BYOO ti nin) treats symptoms of an overactive bladder, such as loss of bladder control or frequent need to urinate. It works by relaxing muscles in the bladder. It belongs to a group of medications called antispasmodics. This medicine may be used for other purposes; ask your health care provider or pharmacist if you have questions. COMMON BRAND NAME(S): Ditropan XL What should I tell my care team before I take this medication? They need to know if you have any of these conditions: Autonomic neuropathy Dementia Glaucoma Intestinal obstruction Kidney disease Liver disease Myasthenia gravis Parkinson's disease Trouble passing urine An unusual or allergic reaction to oxybutynin, other medications, foods, dyes, or preservatives Pregnant or trying to get pregnant Breast-feeding How should I use this medication? Take this medication by mouth with a glass of water. Swallow whole, do not crush, cut, or chew. Follow the directions on the prescription label. You can take this medication with or without food. Take your doses at regular intervals. Do not take your medication more often than directed. Talk to your care team about the use of this medication in children. Special care may be needed. While this medication may be prescribed for children as young as 6 years for selected conditions, precautions do apply. Overdosage: If you think you have taken too much of this medicine contact a poison control center or emergency room at once. NOTE: This medicine is only for you. Do not share this medicine with others. What if I miss a dose? If you miss a dose, take it as soon as you can. If it is almost time for your next dose, take only that dose. Do not take double or extra doses. What may interact with this medication? Antihistamines for allergy, cough, and cold Atropine Certain medications for bladder problems, such as  oxybutynin or tolterodine Certain medications for Parkinson disease, such as benztropine or trihexyphenidyl Certain medications for stomach problems, such as dicyclomine or hyoscyamine Certain medications for travel sickness, such as scopolamine Clarithromycin Erythromycin Ipratropium Medications for fungal infections, such as fluconazole, itraconazole, ketoconazole, or voriconazole This list may not describe all possible interactions. Give your health care provider a list of all the medicines, herbs, non-prescription drugs, or dietary supplements you use. Also tell them if you smoke, drink alcohol, or use illegal drugs. Some items may interact with your medicine. What should I watch for while using this medication? Visit your care team for regular checks on your progress. It may take a few weeks to notice the full benefit from this medication. You may need to limit your intake of tea, coffee, caffeinated sodas, and alcohol. These drinks may make your symptoms worse. This medication may affect your coordination, reaction time, or judgment. Do not drive or operate machinery until you know how this medication affects you. Sit up or stand slowly to reduce the risk of dizzy or fainting spells. Drinking alcohol with this medication can increase the risk of these side effects. Your mouth may get dry. Chewing sugarless gum or sucking hard candy and drinking plenty of water may help. Contact your care team if the problem does not go away or is severe. This medication may cause dry eyes and blurred vision. If you wear contact lenses, you may feel some discomfort. Lubricating eye drops may help. See your care team if the problem does not go away or is severe. You may notice the shells of the tablets in your stool from time to time.  This is normal. Avoid extreme heat. This medication can cause you to sweat less than normal. Your body temperature could increase to dangerous levels, which may lead to heat  stroke. What side effects may I notice from receiving this medication? Side effects that you should report to your care team as soon as possible: Allergic reactions or angioedema--skin rash, itching, hives, swelling of the face, eyes, lips, tongue, arms, or legs, trouble swallowing or breathing Sudden eye pain or change in vision such as blurry vision, seeing halos around lights, vision loss Trouble passing urine Side effects that usually do not require medical attention (report to your care team if they continue or are bothersome): Confusion Constipation Dizziness Drowsiness Dry mouth Headache This list may not describe all possible side effects. Call your doctor for medical advice about side effects. You may report side effects to FDA at 1-800-FDA-1088. Where should I keep my medication? Keep out of the reach of children. Store at room temperature between 15 and 30 degrees C (59 and 86 degrees F). Protect from moisture and humidity. Throw away any unused medication after the expiration date. NOTE: This sheet is a summary. It may not cover all possible information. If you have questions about this medicine, talk to your doctor, pharmacist, or health care provider.  2024 Elsevier/Gold Standard (2021-07-18 00:00:00)   EXERCISE AND DIET:  We recommended that you start or continue a regular exercise program for good health. Regular exercise means any activity that makes your heart beat faster and makes you sweat.  We recommend exercising at least 30 minutes per day at least 3 days a week, preferably 4 or 5.  We also recommend a diet low in fat and sugar.  Inactivity, poor dietary choices and obesity can cause diabetes, heart attack, stroke, and kidney damage, among others.    ALCOHOL AND SMOKING:  Women should limit their alcohol intake to no more than 7 drinks/beers/glasses of wine (combined, not each!) per week. Moderation of alcohol intake to this level decreases your risk of breast cancer  and liver damage. And of course, no recreational drugs are part of a healthy lifestyle.  And absolutely no smoking or even second hand smoke. Most people know smoking can cause heart and lung diseases, but did you know it also contributes to weakening of your bones? Aging of your skin?  Yellowing of your teeth and nails?  CALCIUM AND VITAMIN D:  Adequate intake of calcium and Vitamin D are recommended.  The recommendations for exact amounts of these supplements seem to change often, but generally speaking 600 mg of calcium (either carbonate or citrate) and 800 units of Vitamin D per day seems prudent. Certain women may benefit from higher intake of Vitamin D.  If you are among these women, your doctor will have told you during your visit.    PAP SMEARS:  Pap smears, to check for cervical cancer or precancers,  have traditionally been done yearly, although recent scientific advances have shown that most women can have pap smears less often.  However, every woman still should have a physical exam from her gynecologist every year. It will include a breast check, inspection of the vulva and vagina to check for abnormal growths or skin changes, a visual exam of the cervix, and then an exam to evaluate the size and shape of the uterus and ovaries.  And after 55 years of age, a rectal exam is indicated to check for rectal cancers. We will also provide age appropriate advice  regarding health maintenance, like when you should have certain vaccines, screening for sexually transmitted diseases, bone density testing, colonoscopy, mammograms, etc.   MAMMOGRAMS:  All women over 3 years old should have a yearly mammogram. Many facilities now offer a "3D" mammogram, which may cost around $50 extra out of pocket. If possible,  we recommend you accept the option to have the 3D mammogram performed.  It both reduces the number of women who will be called back for extra views which then turn out to be normal, and it is better  than the routine mammogram at detecting truly abnormal areas.    COLONOSCOPY:  Colonoscopy to screen for colon cancer is recommended for all women at age 72.  We know, you hate the idea of the prep.  We agree, BUT, having colon cancer and not knowing it is worse!!  Colon cancer so often starts as a polyp that can be seen and removed at colonscopy, which can quite literally save your life!  And if your first colonoscopy is normal and you have no family history of colon cancer, most women don't have to have it again for 10 years.  Once every ten years, you can do something that may end up saving your life, right?  We will be happy to help you get it scheduled when you are ready.  Be sure to check your insurance coverage so you understand how much it will cost.  It may be covered as a preventative service at no cost, but you should check your particular policy.

## 2023-06-02 ENCOUNTER — Other Ambulatory Visit: Payer: Self-pay | Admitting: Obstetrics and Gynecology

## 2023-06-02 NOTE — Telephone Encounter (Signed)
 Medication refill request: Venlafaxine  75mg   Last AEX:  05/27/23  Next AEX: 07/08/23 Last MMG (if hormonal medication request): n/a Refill authorized: please advise

## 2023-06-03 ENCOUNTER — Other Ambulatory Visit: Payer: Self-pay | Admitting: Obstetrics and Gynecology

## 2023-06-03 NOTE — Telephone Encounter (Signed)
 Medication refill request: estradiol  patch  Last AEX:  05/27/23 Next AEX: 07/08/23 Last MMG (if hormonal medication request): 12/31/23 Bi-rads 1 neg  Refill authorized: med appears to have been refilled on 05/29/23

## 2023-06-10 NOTE — Telephone Encounter (Signed)
 Call placed to patient, left detailed message, ok per dpr. Advised per Dr. Colvin Dec. Advised if RX is not being managed by PCP return call to office.   Encounter closed.

## 2023-07-08 ENCOUNTER — Ambulatory Visit: Admitting: Obstetrics and Gynecology

## 2023-07-08 NOTE — Progress Notes (Deleted)
 GYNECOLOGY  VISIT   HPI: 53 y.o.   Married  Caucasian female   [phone] with No LMP recorded. (Menstrual status: IUD).   here for: 6 week follow up - Ditropan  XL      GYNECOLOGIC HISTORY: No LMP recorded. (Menstrual status: IUD). Contraception:  IUD  Menopausal hormone therapy:  Vivelle   Last 2 paps:  05/08/22 neg HR HPV neg, 02/19/17 neg HPV neg History of abnormal Pap or positive HPV:  no Mammogram:  12/31/22 Breast Density Cat C, BIRADS Cat 1 neg         OB History     Gravida  3   Para  3   Term  3   Preterm      AB      Living  3      SAB      IAB      Ectopic      Multiple      Live Births                 Patient Active Problem List   Diagnosis Date Noted   Menorrhagia with irregular cycle 07/29/2013    Past Medical History:  Diagnosis Date   Hypertension    IBS (irritable bowel syndrome)    Inner ear disease    Migraine     Past Surgical History:  Procedure Laterality Date   INTRAUTERINE DEVICE (IUD) INSERTION  12/07/2018   Mirena    NASAL SEPTUM SURGERY  01/21/1986   NASAL SINUS SURGERY  1988, 1990   WISDOM TOOTH EXTRACTION      Current Outpatient Medications  Medication Sig Dispense Refill   ALPRAZolam (XANAX) 0.25 MG tablet Take 0.25 mg by mouth daily as needed.     Ascorbic Acid (VITAMIN C) 100 MG tablet Take 500 mg by mouth daily.      calcium carbonate (OS-CAL) 600 MG TABS tablet Take 600 mg by mouth 2 (two) times daily with a meal.     clindamycin (CLEOCIN T) 1 % lotion Apply topically every morning.     Coenzyme Q10 (CO Q-10 PO) Take 1 tablet by mouth daily.     Cyanocobalamin (B-12) 500 MCG TABS Take 1 tablet by mouth daily.     diclofenac (VOLTAREN) 75 MG EC tablet Take 75 mg by mouth 2 (two) times daily.     dicyclomine (BENTYL) 20 MG tablet Take 1 tablet by mouth as needed.  5   estradiol  (VIVELLE -DOT) 0.0375 MG/24HR Place 1 patch onto the skin 2 (two) times a week. 24 patch 3   Fexofenadine HCl (ALLEGRA ALLERGY PO)  Take 1 tablet by mouth as needed.      ibuprofen (ADVIL,MOTRIN) 200 MG tablet Take 200 mg by mouth every 6 (six) hours as needed.     lactobacillus acidophilus (BACID) TABS tablet Take 2 tablets by mouth 3 (three) times daily.     levonorgestrel  (MIRENA ) 20 MCG/24HR IUD 1 each by Intrauterine route once.     levothyroxine (SYNTHROID) 25 MCG tablet Take 25 mcg by mouth every morning.     meclizine (ANTIVERT) 25 MG tablet Take 25 mg by mouth 3 (three) times daily as needed for dizziness.     Methylcellulose, Laxative, (CITRUCEL PO) Take by mouth as needed.      Multiple Vitamin (MULTIVITAMIN) capsule Take 1 capsule by mouth daily.     oxybutynin  (DITROPAN  XL) 10 MG 24 hr tablet Take 1 tablet (10 mg total) by mouth at bedtime. 90 tablet 0  phentermine 30 MG capsule Take 30 mg by mouth daily.     pravastatin (PRAVACHOL) 20 MG tablet Take 20 mg by mouth daily.     SUMAtriptan (IMITREX) 25 MG tablet Take 25 mg by mouth every 2 (two) hours as needed for migraine. May repeat in 2 hours if headache persists or recurs.     tretinoin (RETIN-A) 0.025 % cream Apply topically.     valsartan-hydrochlorothiazide (DIOVAN-HCT) 160-12.5 MG tablet      venlafaxine  XR (EFFEXOR -XR) 75 MG 24 hr capsule      No current facility-administered medications for this visit.     ALLERGIES: Erythromycin and Polymyxin b  Family History  Problem Relation Age of Onset   Hypertension Father    Breast cancer Maternal Aunt        also with fallopian tube CA--DEC   Diabetes Maternal Grandmother    Hypertension Maternal Grandmother    Thyroid  disease Maternal Grandmother    Hypertension Maternal Grandfather    Stroke Maternal Grandfather    Hypertension Paternal Grandmother    Hypertension Paternal Grandfather     Social History   Socioeconomic History   Marital status: Married    Spouse name: Nannette Zill   Number of children: 3   Years of education: College   Highest education level: Not on file  Occupational  History    Employer: [employer]    Comment: Nature conservation officer  Tobacco Use   Smoking status: Never   Smokeless tobacco: Never  Vaping Use   Vaping status: Never Used  Substance and Sexual Activity   Alcohol use: No    Alcohol/week: 0.0 standard drinks of alcohol   Drug use: No   Sexual activity: Yes    Partners: Male    Birth control/protection: I.U.D.    Comment: Mirena  Insertion 12-07-18  Other Topics Concern   Not on file  Social History Narrative   Patient lives at home with her family.   Caffeine Use: 1-2 cups daily   Social Drivers of Corporate investment banker Strain: Not on file  Food Insecurity: Low Risk  (08/29/2022)   Received from [facility]   Hunger Vital Sign    Worried About Running Out of Food in the Last Year: Never true    Ran Out of Food in the Last Year: Never true  Transportation Needs: Not on file (08/29/2022)  Physical Activity: Not on file  Stress: Not on file  Social Connections: Not on file  Intimate Partner Violence: Not on file    Review of Systems  PHYSICAL EXAMINATION:   There were no vitals taken for this visit.    General appearance: alert, cooperative and appears stated age Head: Normocephalic, without obvious abnormality, atraumatic Neck: no adenopathy, supple, symmetrical, trachea midline and thyroid  normal to inspection and palpation Lungs: clear to auscultation bilaterally Breasts: normal appearance, no masses or tenderness, No nipple retraction or dimpling, No nipple discharge or bleeding, No axillary or supraclavicular adenopathy Heart: regular rate and rhythm Abdomen: soft, non-tender, no masses,  no organomegaly Extremities: extremities normal, atraumatic, no cyanosis or edema Skin: Skin color, texture, turgor normal. No rashes or lesions Lymph nodes: Cervical, supraclavicular, and axillary nodes normal. No abnormal inguinal nodes palpated Neurologic: Grossly normal  Pelvic: External genitalia:  no lesions               Urethra:  normal appearing urethra with no masses, tenderness or lesions  Bartholins and Skenes: normal                 Vagina: normal appearing vagina with normal color and discharge, no lesions              Cervix: no lesions                Bimanual Exam:  Uterus:  normal size, contour, position, consistency, mobility, non-tender              Adnexa: no mass, fullness, tenderness              Rectal exam: {yes no:314532}.  Confirms.              Anus:  normal sphincter tone, no lesions  Chaperone was present for exam:  {BSCHAPERONE:31226::Emily F, CMA}  ASSESSMENT:    PLAN:    {LABS (Optional):23779}  ***  total time was spent for this patient encounter, including preparation, face-to-face counseling with the patient, coordination of care, and documentation of the encounter.

## 2023-07-21 DIAGNOSIS — E039 Hypothyroidism, unspecified: Secondary | ICD-10-CM | POA: Diagnosis not present

## 2023-07-28 ENCOUNTER — Telehealth (INDEPENDENT_AMBULATORY_CARE_PROVIDER_SITE_OTHER): Admitting: Obstetrics and Gynecology

## 2023-07-28 ENCOUNTER — Encounter: Payer: Self-pay | Admitting: Obstetrics and Gynecology

## 2023-07-28 DIAGNOSIS — Z5181 Encounter for therapeutic drug level monitoring: Secondary | ICD-10-CM

## 2023-07-28 DIAGNOSIS — Z79890 Hormone replacement therapy (postmenopausal): Secondary | ICD-10-CM | POA: Diagnosis not present

## 2023-07-28 DIAGNOSIS — N3281 Overactive bladder: Secondary | ICD-10-CM

## 2023-07-28 DIAGNOSIS — N951 Menopausal and female climacteric states: Secondary | ICD-10-CM | POA: Diagnosis not present

## 2023-07-28 MED ORDER — GEMTESA 75 MG PO TABS: 75.0000 mg | ORAL_TABLET | Freq: Every day | ORAL | 2 refills | Status: AC

## 2023-07-28 MED ORDER — PROGESTERONE MICRONIZED 100 MG PO CAPS: 100.0000 mg | ORAL_CAPSULE | Freq: Every day | ORAL | 3 refills | Status: AC

## 2023-07-28 MED ORDER — ESTRADIOL 0.0375 MG/24HR TD PTTW: 1.0000 | MEDICATED_PATCH | TRANSDERMAL | 3 refills | Status: AC

## 2023-07-28 NOTE — Patient Instructions (Addendum)
 Progesterone  Capsules What is this medication? PROGESTERONE  (proe JES ter one) prevents the lining of the uterus from becoming too thick in people taking estrogen after menopause. It may also be used to treat irregular menstrual cycles. It works by increasing levels of the hormone progesterone  in your body. This medication is a progestin hormone. This medicine may be used for other purposes; ask your health care provider or pharmacist if you have questions. COMMON BRAND NAME(S): PROMETRIUM  What should I tell my care team before I take this medication? They need to know if you have any of these conditions: Autoimmune disease, such as systemic lupus erythematosus (SLE) Blood vessel disease, blood clotting disorder, or suffered a stroke Breast, cervical, or vaginal cancer Dementia Diabetes Heart disease, high blood pressure or recent heart attack High blood lipids or cholesterol Hysterectomy Kidney disease Liver disease Recent miscarriage Tobacco use Vaginal bleeding An unusual or allergic reaction to progesterone , peanuts, other medications, foods, dyes, or preservatives Pregnant or trying to get pregnant Breastfeeding How should I use this medication? Take this medication by mouth with a glass of water. Follow the directions on the prescription label. Take your doses at regular intervals. Do not take your medication more often than directed. Talk to your care team about the use of this medication in children. Special care may be needed. A patient package insert for the product will be given with each prescription and refill. Read this sheet carefully each time. The sheet may change frequently. Overdosage: If you think you have taken too much of this medicine contact a poison control center or emergency room at once. NOTE: This medicine is only for you. Do not share this medicine with others. What if I miss a dose? If you miss a dose, take it as soon as you can. If it is almost time for  your next dose, take only that dose. Do not take double or extra doses. What may interact with this medication? Do not take this medication with any of the following: Bosentan This medication may also interact with the following: Barbiturate medications for sleep or seizures Bexarotene Carbamazepine Ethotoin Ketoconazole Phenytoin Rifampin This list may not describe all possible interactions. Give your health care provider a list of all the medicines, herbs, non-prescription drugs, or dietary supplements you use. Also tell them if you smoke, drink alcohol, or use illegal drugs. Some items may interact with your medicine. What should I watch for while using this medication? Visit your care team for regular checks on your progress. This medication can cause tooth and gum problems. Tenderness, swelling, or minor bleeding of the gums may occur. Brushing and flossing your teeth regularly may reduce the risk of side effects. Visit your dentist on a regular basis. Tell your dentist about any medications you are taking. This medication may affect your coordination, reaction time, or judgment. Do not drive or operate machinery until you know how this medication affects you. Sit up or stand slowly to reduce the risk of dizzy or fainting spells. Drinking alcohol with this medication can increase the risk of these side effects. What side effects may I notice from receiving this medication? Side effects that you should report to your care team as soon as possible: Allergic reactions--skin rash, itching, hives, swelling of the face, lips, tongue, or throat Blood clot--pain, swelling, or warmth in the leg, shortness of breath, chest pain Breast tissue changes, new lumps, redness, pain, or discharge from the nipple Liver injury--right upper belly pain, loss of appetite, nausea,  light-colored stool, dark yellow or brown urine, yellowing skin or eyes, unusual weakness or fatigue New or worsening migraines or  headaches Stroke--sudden numbness or weakness of the face, arm, or leg, trouble speaking, confusion, trouble walking, loss of balance or coordination, dizziness, severe headache, change in vision Sudden eye pain or change in vision such as blurry vision, seeing halos around lights, vision loss Unusual vaginal discharge, itching, or odor Worsening mood, feelings of depression Side effects that usually do not require medical attention (report to your care team if they continue or are bothersome): Bloating Breast pain or tenderness Dizziness Drowsiness Headache Irregular menstrual cycles or spotting Mood swings Nausea Swelling of the ankles, hands, or feet This list may not describe all possible side effects. Call your doctor for medical advice about side effects. You may report side effects to FDA at 1-800-FDA-1088. Where should I keep my medication? Keep out of the reach of children. Store at room temperature between 15 and 30 degrees C (59 and 86 degrees F). Protect from light. Keep container tightly closed. Throw away any unused medication after the expiration date. NOTE: This sheet is a summary. It may not cover all possible information. If you have questions about this medicine, talk to your doctor, pharmacist, or health care provider.  2024 Elsevier/Gold Standard (2021-11-15 00:00:00)  Vibegron  Tablets What is this medication? VIBEGRON  (vye BEG ron) treats symptoms of an overactive bladder, such as loss of bladder control or frequent need to urinate. It works by relaxing muscles in the bladder. It belongs to a group of medications called antispasmodics. This medicine may be used for other purposes; ask your health care provider or pharmacist if you have questions. COMMON BRAND NAME(S): GEMTESA  What should I tell my care team before I take this medication? They need to know if you have any of these conditions: Kidney disease Liver disease Prostate disease Trouble passing urine An  unusual or allergic reaction to vibegron , other medications, foods, dyes, or preservatives Pregnant or trying to get pregnant Breast-feeding How should I use this medication? Take this medication by mouth with water. Take it as directed on the prescription label at the same time every day. Swallow the tablets whole. You may crush the tablet and mix with 1 tablespoon (15 mL) of applesauce. Swallow the medication and applesauce right away. Keep taking it unless your care team tells you to stop. You can take it with or without food. If it upsets your stomach, take it with food. Talk to your care team about the use of this medication in children. Special care may be needed. Overdosage: If you think you have taken too much of this medicine contact a poison control center or emergency room at once. NOTE: This medicine is only for you. Do not share this medicine with others. What if I miss a dose? If you miss a dose, take it as soon as you can. If it is almost time for your next dose, take only that dose. Do not take double or extra doses. What may interact with this medication? This medication may interact with the following: Digoxin This list may not describe all possible interactions. Give your health care provider a list of all the medicines, herbs, non-prescription drugs, or dietary supplements you use. Also tell them if you smoke, drink alcohol, or use illegal drugs. Some items may interact with your medicine. What should I watch for while using this medication? Visit your care team for regular checks on your progress. Tell your care  team if your symptoms do not start to get better or if they get worse. You may need to limit your intake of tea, coffee, caffeinated drinks, or alcohol. These drinks may make your symptoms worse. What side effects may I notice from receiving this medication? Side effects that you should report to your care team as soon as possible: Allergic reactions--skin rash,  itching, hives, swelling of the face, lips, tongue, or throat Trouble passing urine Side effects that usually do not require medical attention (report to your care team if they continue or are bothersome): Diarrhea Headache Nausea Runny or stuffy nose Sore throat This list may not describe all possible side effects. Call your doctor for medical advice about side effects. You may report side effects to FDA at 1-800-FDA-1088. Where should I keep my medication? Keep out of the reach of children and pets. Store at room temperature between 20 and 25 degrees C (68 and 77 degrees F). Get rid of any unused medication after the expiration date. To get rid of medications that are no longer needed or have expired: Take the medication to a medication take-back program. Check with your pharmacy or law enforcement to find a location. If you cannot return the medication, check the label or package insert to see if the medication should be thrown out in the garbage or flushed down the toilet. If you are not sure, ask your care team. If it is safe to put it in the trash, empty the medication out of the container. Mix the medication with cat litter, dirt, coffee grounds, or other unwanted substance. Seal the mixture in a bag or container. Put it in the trash. NOTE: This sheet is a summary. It may not cover all possible information. If you have questions about this medicine, talk to your doctor, pharmacist, or health care provider.  2024 Elsevier/Gold Standard (2021-01-29 00:00:00)

## 2023-07-28 NOTE — Progress Notes (Unsigned)
 GYNECOLOGY  VISIT   HPI: 53 y.o.   Married  Caucasian female   [phone] with No LMP recorded. (Menstrual status: IUD).   here for: 6 week follow up - oxybutynin  and vivelle .   The visit today was a virtual video visit.  The patient confirms her identity with 2 identifiers.  She gives permission for the visit today.  She is at her home and I am in my private office at work.  Estrogen Vivelle  Dot 0.0375 mg twice weekly is helping her hot flashes and she is feeling much better on this lowered dosage. She does report spotting this past weekend.  She has a Mirena  IUD placed 12/07/18.  She stopped the oxybutynin  due to dry mouth, bad taste in her mouth, and concern for association with dementia.   She has difficulty getting to the bathroom on time at night, and she is worried about having loss of control of her bladder during he day.    GYNECOLOGIC HISTORY: No LMP recorded. (Menstrual status: IUD). Contraception:  IUD  Menopausal hormone therapy:  vivelle   Last 2 paps:  05/08/22 neg HR HPV neg, 02/19/17 neg HPV neg  History of abnormal Pap or positive HPV:  no Mammogram:  12/31/22 Breast Density Cat C, BIRADS Cat 1 neg         OB History     Gravida  3   Para  3   Term  3   Preterm      AB      Living  3      SAB      IAB      Ectopic      Multiple      Live Births                 Patient Active Problem List   Diagnosis Date Noted   Menorrhagia with irregular cycle 07/29/2013    Past Medical History:  Diagnosis Date   Hypertension    IBS (irritable bowel syndrome)    Inner ear disease    Migraine     Past Surgical History:  Procedure Laterality Date   INTRAUTERINE DEVICE (IUD) INSERTION  12/07/2018   Mirena    NASAL SEPTUM SURGERY  01/21/1986   NASAL SINUS SURGERY  1988, 1990   WISDOM TOOTH EXTRACTION      Current Outpatient Medications  Medication Sig Dispense Refill   ALPRAZolam (XANAX) 0.25 MG tablet Take 0.25 mg by mouth daily as  needed.     Ascorbic Acid (VITAMIN C) 100 MG tablet Take 500 mg by mouth daily.      calcium carbonate (OS-CAL) 600 MG TABS tablet Take 600 mg by mouth 2 (two) times daily with a meal.     Coenzyme Q10 (CO Q-10 PO) Take 1 tablet by mouth daily.     Cyanocobalamin (B-12) 500 MCG TABS Take 1 tablet by mouth daily.     diclofenac (VOLTAREN) 75 MG EC tablet Take 75 mg by mouth 2 (two) times daily.     dicyclomine (BENTYL) 20 MG tablet Take 1 tablet by mouth as needed.  5   estradiol  (VIVELLE -DOT) 0.0375 MG/24HR Place 1 patch onto the skin 2 (two) times a week. 24 patch 3   Fexofenadine HCl (ALLEGRA ALLERGY PO) Take 1 tablet by mouth as needed.      ibuprofen (ADVIL,MOTRIN) 200 MG tablet Take 200 mg by mouth every 6 (six) hours as needed.     lactobacillus acidophilus (BACID) TABS tablet Take  2 tablets by mouth 3 (three) times daily.     levonorgestrel  (MIRENA ) 20 MCG/24HR IUD 1 each by Intrauterine route once.     levothyroxine (SYNTHROID) 25 MCG tablet Take 25 mcg by mouth every morning.     meclizine (ANTIVERT) 25 MG tablet Take 25 mg by mouth 3 (three) times daily as needed for dizziness.     Methylcellulose, Laxative, (CITRUCEL PO) Take by mouth as needed.      Multiple Vitamin (MULTIVITAMIN) capsule Take 1 capsule by mouth daily.     oxybutynin  (DITROPAN  XL) 10 MG 24 hr tablet Take 1 tablet (10 mg total) by mouth at bedtime. 90 tablet 0   phentermine 30 MG capsule Take 30 mg by mouth daily.     pravastatin (PRAVACHOL) 20 MG tablet Take 20 mg by mouth daily.     predniSONE (STERAPRED UNI-PAK 48 TAB) 5 MG (48) TBPK tablet Take by mouth as directed.     SUMAtriptan (IMITREX) 25 MG tablet Take 25 mg by mouth every 2 (two) hours as needed for migraine. May repeat in 2 hours if headache persists or recurs.     tretinoin (RETIN-A) 0.025 % cream Apply topically.     valsartan-hydrochlorothiazide (DIOVAN-HCT) 160-12.5 MG tablet      venlafaxine  XR (EFFEXOR -XR) 75 MG 24 hr capsule      clindamycin  (CLEOCIN T) 1 % lotion Apply topically every morning. (Patient not taking: Reported on 07/28/2023)     No current facility-administered medications for this visit.     ALLERGIES: Erythromycin and Polymyxin b  Family History  Problem Relation Age of Onset   Hypertension Father    Breast cancer Maternal Aunt        also with fallopian tube CA--DEC   Diabetes Maternal Grandmother    Hypertension Maternal Grandmother    Thyroid  disease Maternal Grandmother    Hypertension Maternal Grandfather    Stroke Maternal Grandfather    Hypertension Paternal Grandmother    Hypertension Paternal Grandfather     Social History   Socioeconomic History   Marital status: Married    Spouse name: Avalynn Bowe   Number of children: 3   Years of education: College   Highest education level: Not on file  Occupational History    Employer: [employer]    Comment: Nature conservation officer  Tobacco Use   Smoking status: Never   Smokeless tobacco: Never  Vaping Use   Vaping status: Never Used  Substance and Sexual Activity   Alcohol use: No    Alcohol/week: 0.0 standard drinks of alcohol   Drug use: No   Sexual activity: Yes    Partners: Male    Birth control/protection: I.U.D.    Comment: Mirena  Insertion 12-07-18  Other Topics Concern   Not on file  Social History Narrative   Patient lives at home with her family.   Caffeine Use: 1-2 cups daily   Social Drivers of Corporate investment banker Strain: Not on file  Food Insecurity: Low Risk  (08/29/2022)   Received from [facility]   Hunger Vital Sign    Within the past 12 months, you worried that your food would run out before you got money to buy more: Never true    Within the past 12 months, the food you bought just didn't last and you didn't have money to get more. : Never true  Transportation Needs: Not on file (08/29/2022)  Physical Activity: Not on file  Stress: Not on file  Social Connections:  Not on file  Intimate Partner Violence:  Not on file    Review of Systems  All other systems reviewed and are negative.   PHYSICAL EXAMINATION:   There were no vitals taken for this visit.    General appearance: alert, cooperative and appears stated age   ASSESSMENT:  Menopausal symptoms.  HRT.  Vivelle  Dot 0.0375 mg twice weekly and Mirena  IUD.  Mirena  placed 11/2018. Recent vaginal spotting.  Overactive bladder.  Encounter for medication monitoring.  HTN.   PLAN:  Will continue Vivelle  Dot 0.0375 mg twice weekly.  #24, RF 3. We discussed adding Prometrium  100 mg q hs versus changing her Mirena  IUD.  Will add Prometrium  100 mg q hs.  #90, RF 3.  Start Gemtesa  75 mg daily to treat overactive bladder.  Risks and benefits reviewed.  #30, RF 2.   Patient will monitor her BP at home.  She will contact me if her BP is 140/90 or higher with adding he Gemtesa .  Fu for annual exam and prn.   Consultation length:  14:59 minutes from 2:45 PM  to 2:59 PM  20 min  total time was spent for this patient encounter, including preparation, face-to-face counseling with the patient, coordination of care, and documentation of the encounter.

## 2023-08-01 ENCOUNTER — Telehealth: Payer: Self-pay

## 2023-08-01 NOTE — Telephone Encounter (Signed)
 Received PA request on Gemtesa  75MG  Tablests  Started the PA process via covermymeds portal   KEY: [id number]  Sent pt a message making her aware of this.    DX: N32.81

## 2023-08-05 NOTE — Telephone Encounter (Signed)
 Patient has concerns about potential neurologic side effects of anticholinergic medications.   We will need to file an appeal for the Gemtesa .

## 2023-08-05 NOTE — Telephone Encounter (Signed)
 Letter started for appeal of Gemtesa  and routed to Dr. Nikki tp provide any additional information.   Cc: Geni

## 2023-08-05 NOTE — Telephone Encounter (Signed)
 The Gemtesa  was denied.   Covermymeds offered alternatives that may be covered by pt's plan.   Alternatives are: Darifenacin ER Fesoterodine ER Oxybutynin  5MG  IR tablet Oxybutynin  ER tablet  Oxybutynin  syrup tolterodine ER capsule Tolterodine tablet  Trospium ER Capsule Trospium tablet  Please advise on how you would like to move forward.   I have sent a mychart message to the pt making her aware of this.

## 2023-08-05 NOTE — Telephone Encounter (Signed)
 I modified the letter slightly.  Please print so I can sign it.  Thank you.

## 2023-08-06 DIAGNOSIS — M170 Bilateral primary osteoarthritis of knee: Secondary | ICD-10-CM | POA: Diagnosis not present

## 2023-08-06 NOTE — Telephone Encounter (Signed)
 I have faxed the letter to [phone] and got a conformation that the fax was successful.

## 2023-08-06 NOTE — Telephone Encounter (Signed)
 Appeal letter printed for Dr. Nikki to sign.

## 2023-08-06 NOTE — Telephone Encounter (Signed)
Patient has been made aware of this

## 2023-09-04 NOTE — Telephone Encounter (Signed)
 LM on [phone] to get a call back to check the status on the Appeal that was submitted.   Pt is aware of this via a mychart message

## 2023-09-16 DIAGNOSIS — E669 Obesity, unspecified: Secondary | ICD-10-CM | POA: Diagnosis not present

## 2023-09-16 DIAGNOSIS — E039 Hypothyroidism, unspecified: Secondary | ICD-10-CM | POA: Diagnosis not present

## 2023-09-16 DIAGNOSIS — I10 Essential (primary) hypertension: Secondary | ICD-10-CM | POA: Diagnosis not present

## 2023-09-16 DIAGNOSIS — E7800 Pure hypercholesterolemia, unspecified: Secondary | ICD-10-CM | POA: Diagnosis not present

## 2023-09-30 DIAGNOSIS — R42 Dizziness and giddiness: Secondary | ICD-10-CM | POA: Diagnosis not present

## 2023-09-30 DIAGNOSIS — H6993 Unspecified Eustachian tube disorder, bilateral: Secondary | ICD-10-CM | POA: Diagnosis not present

## 2023-10-09 DIAGNOSIS — H93293 Other abnormal auditory perceptions, bilateral: Secondary | ICD-10-CM | POA: Diagnosis not present

## 2023-10-27 ENCOUNTER — Other Ambulatory Visit

## 2023-10-27 DIAGNOSIS — Z975 Presence of (intrauterine) contraceptive device: Secondary | ICD-10-CM

## 2023-10-28 ENCOUNTER — Ambulatory Visit: Payer: Self-pay | Admitting: Obstetrics and Gynecology

## 2023-10-28 LAB — FOLLICLE STIMULATING HORMONE: FSH: 35 m[IU]/mL

## 2023-12-25 ENCOUNTER — Telehealth: Payer: Self-pay

## 2023-12-25 NOTE — Telephone Encounter (Signed)
 Patient called and stated Dr Nikki wrote her a rx for Gemtesa  in July 2025. At the time she had BCBS and they would not cover the rx for her. She states she now has Community Education Officer. She wanted to see if they would maybe cover it. She is aware her rx if still on file at the pharmacy. She will call them to give them her new insurance information so they can check that for her.

## 2023-12-26 NOTE — Telephone Encounter (Signed)
 Thank you for the update!

## 2024-01-13 ENCOUNTER — Telehealth: Payer: Self-pay

## 2024-01-13 NOTE — Telephone Encounter (Signed)
 Prior authorization has been created and approved for Gemtesa  through CoverMyMeds. Patient was sent a clinical cytogeneticist message regarding approval.   (Key: [id number])

## 2024-06-03 ENCOUNTER — Ambulatory Visit: Admitting: Obstetrics and Gynecology

## 2024-06-17 ENCOUNTER — Ambulatory Visit: Admitting: Obstetrics and Gynecology
